# Patient Record
Sex: Male | Born: 1960 | Race: Black or African American | Hispanic: No | Marital: Single | Smoking: Never smoker
Health system: Southern US, Community
[De-identification: ages and names within clinical notes are randomized; demographics above are authoritative.]

## PROBLEM LIST (undated history)

## (undated) DIAGNOSIS — I10 Essential (primary) hypertension: Secondary | ICD-10-CM

## (undated) DIAGNOSIS — J189 Pneumonia, unspecified organism: Secondary | ICD-10-CM

## (undated) HISTORY — PX: HAND SURGERY: SHX662

---

## 1998-03-26 ENCOUNTER — Ambulatory Visit: Admission: RE | Admit: 1998-03-26 | Discharge: 1998-03-26 | Payer: Self-pay | Admitting: Psychiatry

## 2000-10-28 ENCOUNTER — Emergency Department (HOSPITAL_COMMUNITY): Admission: EM | Admit: 2000-10-28 | Discharge: 2000-10-28 | Payer: Self-pay | Admitting: Emergency Medicine

## 2000-10-28 ENCOUNTER — Encounter: Payer: Self-pay | Admitting: Emergency Medicine

## 2000-11-01 ENCOUNTER — Inpatient Hospital Stay (HOSPITAL_COMMUNITY): Admission: EM | Admit: 2000-11-01 | Discharge: 2000-11-16 | Payer: Self-pay | Admitting: Emergency Medicine

## 2000-11-01 ENCOUNTER — Encounter: Payer: Self-pay | Admitting: Orthopedic Surgery

## 2000-11-01 ENCOUNTER — Encounter (INDEPENDENT_AMBULATORY_CARE_PROVIDER_SITE_OTHER): Payer: Self-pay | Admitting: Specialist

## 2000-11-01 ENCOUNTER — Encounter (INDEPENDENT_AMBULATORY_CARE_PROVIDER_SITE_OTHER): Payer: Self-pay | Admitting: *Deleted

## 2000-11-02 ENCOUNTER — Encounter: Payer: Self-pay | Admitting: Internal Medicine

## 2000-11-05 ENCOUNTER — Encounter: Payer: Self-pay | Admitting: Internal Medicine

## 2000-11-07 ENCOUNTER — Encounter: Payer: Self-pay | Admitting: Orthopedic Surgery

## 2000-11-10 ENCOUNTER — Encounter: Payer: Self-pay | Admitting: Internal Medicine

## 2000-11-14 ENCOUNTER — Encounter: Payer: Self-pay | Admitting: Internal Medicine

## 2001-06-20 ENCOUNTER — Encounter: Payer: Self-pay | Admitting: Emergency Medicine

## 2001-06-20 ENCOUNTER — Emergency Department (HOSPITAL_COMMUNITY): Admission: EM | Admit: 2001-06-20 | Discharge: 2001-06-20 | Payer: Self-pay | Admitting: Emergency Medicine

## 2001-06-28 ENCOUNTER — Emergency Department (HOSPITAL_COMMUNITY): Admission: EM | Admit: 2001-06-28 | Discharge: 2001-06-28 | Payer: Self-pay | Admitting: Emergency Medicine

## 2001-10-08 ENCOUNTER — Encounter: Payer: Self-pay | Admitting: Emergency Medicine

## 2001-10-08 ENCOUNTER — Emergency Department (HOSPITAL_COMMUNITY): Admission: EM | Admit: 2001-10-08 | Discharge: 2001-10-08 | Payer: Self-pay | Admitting: Emergency Medicine

## 2002-06-03 ENCOUNTER — Emergency Department (HOSPITAL_COMMUNITY): Admission: EM | Admit: 2002-06-03 | Discharge: 2002-06-03 | Payer: Self-pay | Admitting: Emergency Medicine

## 2002-10-21 ENCOUNTER — Emergency Department (HOSPITAL_COMMUNITY): Admission: EM | Admit: 2002-10-21 | Discharge: 2002-10-21 | Payer: Self-pay | Admitting: Emergency Medicine

## 2003-02-12 ENCOUNTER — Encounter: Payer: Self-pay | Admitting: Emergency Medicine

## 2003-02-12 ENCOUNTER — Emergency Department (HOSPITAL_COMMUNITY): Admission: EM | Admit: 2003-02-12 | Discharge: 2003-02-12 | Payer: Self-pay | Admitting: Emergency Medicine

## 2003-10-04 ENCOUNTER — Emergency Department (HOSPITAL_COMMUNITY): Admission: EM | Admit: 2003-10-04 | Discharge: 2003-10-04 | Payer: Self-pay | Admitting: Emergency Medicine

## 2003-11-16 ENCOUNTER — Emergency Department (HOSPITAL_COMMUNITY): Admission: EM | Admit: 2003-11-16 | Discharge: 2003-11-16 | Payer: Self-pay | Admitting: Unknown Physician Specialty

## 2004-01-31 ENCOUNTER — Emergency Department (HOSPITAL_COMMUNITY): Admission: EM | Admit: 2004-01-31 | Discharge: 2004-01-31 | Payer: Self-pay | Admitting: Emergency Medicine

## 2004-08-13 ENCOUNTER — Emergency Department (HOSPITAL_COMMUNITY): Admission: EM | Admit: 2004-08-13 | Discharge: 2004-08-13 | Payer: Self-pay

## 2005-07-10 ENCOUNTER — Emergency Department (HOSPITAL_COMMUNITY): Admission: EM | Admit: 2005-07-10 | Discharge: 2005-07-10 | Payer: Self-pay | Admitting: Emergency Medicine

## 2005-09-03 ENCOUNTER — Emergency Department (HOSPITAL_COMMUNITY): Admission: EM | Admit: 2005-09-03 | Discharge: 2005-09-03 | Payer: Self-pay | Admitting: Emergency Medicine

## 2010-01-23 ENCOUNTER — Emergency Department (HOSPITAL_COMMUNITY): Admission: EM | Admit: 2010-01-23 | Discharge: 2010-01-23 | Payer: Self-pay | Admitting: Emergency Medicine

## 2010-02-03 ENCOUNTER — Emergency Department (HOSPITAL_COMMUNITY): Admission: EM | Admit: 2010-02-03 | Discharge: 2010-02-03 | Payer: Self-pay | Admitting: Family Medicine

## 2010-12-02 ENCOUNTER — Emergency Department (HOSPITAL_COMMUNITY)
Admission: EM | Admit: 2010-12-02 | Discharge: 2010-12-02 | Disposition: A | Discharge status: Home / Self Care | Payer: Self-pay | Attending: Emergency Medicine | Admitting: Emergency Medicine

## 2010-12-02 ENCOUNTER — Emergency Department (HOSPITAL_COMMUNITY): Payer: Self-pay

## 2010-12-02 DIAGNOSIS — S92309A Fracture of unspecified metatarsal bone(s), unspecified foot, initial encounter for closed fracture: Secondary | ICD-10-CM | POA: Insufficient documentation

## 2010-12-02 DIAGNOSIS — M79609 Pain in unspecified limb: Secondary | ICD-10-CM | POA: Insufficient documentation

## 2010-12-02 DIAGNOSIS — X500XXA Overexertion from strenuous movement or load, initial encounter: Secondary | ICD-10-CM | POA: Insufficient documentation

## 2010-12-07 ENCOUNTER — Emergency Department (HOSPITAL_COMMUNITY)
Admission: EM | Admit: 2010-12-07 | Discharge: 2010-12-07 | Disposition: A | Discharge status: Home / Self Care | Payer: Self-pay | Attending: Emergency Medicine | Admitting: Emergency Medicine

## 2010-12-07 DIAGNOSIS — S92309A Fracture of unspecified metatarsal bone(s), unspecified foot, initial encounter for closed fracture: Secondary | ICD-10-CM | POA: Insufficient documentation

## 2010-12-07 DIAGNOSIS — X58XXXA Exposure to other specified factors, initial encounter: Secondary | ICD-10-CM | POA: Insufficient documentation

## 2010-12-07 DIAGNOSIS — M79609 Pain in unspecified limb: Secondary | ICD-10-CM | POA: Insufficient documentation

## 2011-01-28 ENCOUNTER — Inpatient Hospital Stay (INDEPENDENT_AMBULATORY_CARE_PROVIDER_SITE_OTHER)
Admission: RE | Admit: 2011-01-28 | Discharge: 2011-01-28 | Disposition: A | Discharge status: Home / Self Care | Payer: Self-pay | Source: Ambulatory Visit

## 2011-01-28 DIAGNOSIS — S92909A Unspecified fracture of unspecified foot, initial encounter for closed fracture: Secondary | ICD-10-CM

## 2011-02-05 NOTE — Op Note (Signed)
[place]. [facility] ([facility])  Patient:    Dennis Vega, Dennis Vega                       MRN: [id number] Proc. Date: 11/09/00 Adm. Date:  [id number] Attending:  Dominica Severin CC:         Dewayne Shorter, M.D.  Lindell Spar. Chestine Spore, M.D.   Operative Report  PREOPERATIVE DIAGNOSIS:  Status cellulitis right hand, resolved with residual loss of motion about the hand and fluid in the radiocarpal joint, rule out indolent sepsis versus chronic synovitis.  POSTOPERATIVE DIAGNOSIS:  Positive fluid without gross purulence in the radiocarpal and mid carpal joints which was incised and drained.  The patient had a tenosynovitic reaction in this lesion.  This was debrided and culture sent.  The patient also had dense adhesions in the 4th dorsal compartment with noted tenosynovitis in this region.  This underwent incision and drainage and tenolysis as well as decompression of the compartment.  The patient also underwent 3rd and 2nd extensor compartment incisions without evidence for gross purulence.  OPERATION: 1. Incision and drainage/arthrotomy of right wrist joint including the    radiocarpal and mid carpal joints with synovectomy and culture. 2. Incision 3rd dorsal compartment (EPL tendon sheath). 3. Incision 2nd dorsal compartment sheath (ECRL dorsal compartment sheath). 4. A 4th dorsal compartment incision and tenosynovectomy with tenolysis about    the extensor tendon apparatus. 5. Manipulation of the MCP joints.  This was done about the index through    small finger MCP joints.  SURGEON:  Elisha Ponder, M.D.  ASSISTANT:  Della Goo, P.A.  COMPLICATIONS:  None.  ANESTHESIA:  Axillary block with IV sedation.  TOURNIQUET TIME:  Less than an hour.  ESTIMATED BLOOD LOSS:  Minimal.  DRAINS:  One  Cultures taken including aerobic, anaerobic, fungal and atypical mycobacterial cultures.  OPERATIVE INDICATIONS:  This patient is a very pleasant black male  who presents for the above mentioned procedure.  The patient was originally seen by myself.  He was admitted to the hospital secondary to cellulitis about the right hand.  I also noted interval changes in his chest x-ray and a pneumonia was found which is being addressed by Dr. Margaretmary Bayley.  The patient is currently on Tequin.  He had four days of Unasyn prior to Tequin being started.  The patient has resolved the cellulitis in his hand excellently; however, he has localized pain to the radial carpal joint.  He has had two aspirations and detailed exams by myself.  Despite antibiotics he is still tender in his wrist.  He has developed some increasing pain in the wrist as well as loss of function in the fingers.  His MPC joints are significantly stiff and he has poor flexion and extension about the fingers.  I have discussed with the patient that although his laboratory parameters have normalized, I can not rule out indolent infection still present in his wrist and given his lack of clinical response to range of motion in wrist testing, I would prefer to take an operative look at his wrist and ensure that there is not significant indolent infection present.  MRIs have been performed and a very thorough evaluation undertaken in my opinion.  ID has been consulted as well.  The patient understands the risks and benefits of surgery and desires to proceed.  OPERATIVE FINDINGS:  This patient had chronic type of synovitic reaction in the wrist joint.  There was no  gross purulence but there was fluid that was cultured.  This was not thick and indicative of gross purulence.  There was no foul smell.  I did culture this.  It did come from both the radiocarpal and mid carpal joints.  Scapholunate interosseous ligament was kept intact.  I performed a synovectomy and sent the synovial tissue for culture and pathology.  The patient underwent incision of the EPL and ECRB tendon sheath. No purulence was  noted.  Some inflammatory changes about the EPL were noted but certainly this was not excessively impressive.  The 4th dorsal compartment had an impressive tenosynovitic reaction around the tendons as well as adhesions.  I performed a tenosynovectomy and lysis of adhesions in this region as well as manipulation to the MCP joints which restored his MCP range of motion passively to normal.  Preoperatively passive range of motion was restricted.  DESCRIPTION OF PROCEDURE:  The patient was supine subsequently underwent anesthesia.  Dr. Noreene Larsson presided over anesthetic measures and instituted a right upper extremity axillary block at my request.  This was tolerated by the patient excellently.  He was taken to the operative suite, laid supine, appropriately padded, prepped and draped in the usual sterile fashion with Betadine scrub and paint.  Following this, the patient was given IV sedation. The arm was elevated.  Tourniquet insufflated to 250 mmHg and incision approximately 4 cm was made just distal to Listers tubercle.  Dissection was carried through skin with knife blade.  Following this, blunt dissection was carried down to the extensor retinaculum.  The extensor retinaculum was identified and split.  I then opened sequentially the EPL tendon sheath.  EPL was without any gross fluid within the sheath.  There was no disruption of the tendon.  Change hemorrhagic changes but no focal synovitic reaction that needed to be debrided.  The ECR tendon sheath was then opened. I similarly noted no significant tenosynovitic reaction in this region.  Following this, the patient had access to the radial carpal and mid carpal joints obtained. This was done with knife dissection.  This was done just off of the radial carpal capsule.  A T incision was made to access the radial carpal joint. Fluid, fairly clear to brown in nature but without any thick purulence was noted.  This was cultured.  Following this  a synovectomy was performed by  thickened synovium was noted.  Following this, very small horizontal cuts were made so that the main carpal joint could be entered without disrupting the scapholunate interosseous ligament.  This was done to my satisfaction and the mid carpal joint was opened and following this, 2 cc of fluid egressed.  This was similarly clearly thin, slightly clear to brown tinged.  The patient had a synovitic reaction noted in the joint and this was removed with a sharp knife blade and rongeur.  Cultures were taken at this time and tissue was sent for pathologic purposes.  Following this, the patient underwent thorough irrigation of the radial carpal and mid carpal joints.  Freer elevator was placed in both joints.  A Watson scaphoid shift test was performed to demonstrate SL interval competency and the patient had viewing of the SL interval which was noted to competent.  A meticulous synovectomy was performed.  I was very happy with this.  I was not overly impressed with purulent findings.  The patient then underwent incision of the 4th dorsal compartment.  The 4th dorsal compartment had a marked tenosynovitis and adhesions in this  region.  I performed at this time a tenolysis and a tenosynovectomy about the extensor tendon. Manipulation of the index to the small finger MCP joints was accomplished which restored MCP passive range of motion to normal.  This was done to my satisfaction without difficulty. Following this, further adhesions and synovitic tissue was removed from the 4th dorsal compartment.  This was done under 4.0 loupe magnification without difficulty.  Following this, the tourniquet was deflated.  All areas were copiously irrigated.  I was most impressed with the tenosynovitic reaction in the 4th dorsal compartment and adhesions in this region.  The radial carpal and mid carpal joints certainly had synovitis noted.  There was no thick purulence  noted here.  The patient had a drain placed without difficulty and the wound loosely closed.  He had excellent refill and no excessive bleeding as bleeding was controlled with bipolar electrocautery.  The patient tolerated the procedure well without difficulty.  All sponge and instrument counts were reported as correct.  He had a compressive dressing followed by volar plaster splint placed without difficulty at the conclusion of the operation.  The patient will be continued on Tequin antibiotics. O2 will be instituted for range of motion and close observation performed.  I have asked him to notify me should any problems occur, etc.  Will monitor his condition closely. DD:  11/09/00 TD:  11/10/00 Job: [id number]
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
## 2011-03-01 ENCOUNTER — Inpatient Hospital Stay (HOSPITAL_COMMUNITY)
Admission: EM | Admit: 2011-03-01 | Discharge: 2011-03-01 | DRG: 605 | Disposition: A | Discharge status: Home / Self Care | Payer: Self-pay | Attending: Surgery | Admitting: Surgery

## 2011-03-01 ENCOUNTER — Inpatient Hospital Stay (HOSPITAL_COMMUNITY): Payer: Self-pay

## 2011-03-01 ENCOUNTER — Emergency Department (HOSPITAL_COMMUNITY): Payer: Self-pay

## 2011-03-01 DIAGNOSIS — F17200 Nicotine dependence, unspecified, uncomplicated: Secondary | ICD-10-CM | POA: Diagnosis present

## 2011-03-01 DIAGNOSIS — Y92009 Unspecified place in unspecified non-institutional (private) residence as the place of occurrence of the external cause: Secondary | ICD-10-CM

## 2011-03-01 DIAGNOSIS — S31109A Unspecified open wound of abdominal wall, unspecified quadrant without penetration into peritoneal cavity, initial encounter: Principal | ICD-10-CM | POA: Diagnosis present

## 2011-03-01 DIAGNOSIS — R0902 Hypoxemia: Secondary | ICD-10-CM | POA: Diagnosis present

## 2011-03-01 DIAGNOSIS — F1010 Alcohol abuse, uncomplicated: Secondary | ICD-10-CM | POA: Diagnosis present

## 2011-03-01 DIAGNOSIS — S41009A Unspecified open wound of unspecified shoulder, initial encounter: Secondary | ICD-10-CM | POA: Diagnosis present

## 2011-03-01 DIAGNOSIS — Y998 Other external cause status: Secondary | ICD-10-CM

## 2011-03-01 LAB — CBC
HCT: 43.5 % (ref 39.0–52.0)
MCH: 32.9 pg (ref 26.0–34.0)
MCHC: 35.7 g/dL (ref 30.0–36.0)
MCV: 92 fL (ref 78.0–100.0)
Platelets: 273 10*3/uL (ref 150–400)
Platelets: 232 10*3/uL (ref 150–400)
RBC: 4.9 MIL/uL (ref 4.22–5.81)
RDW: 12.5 % (ref 11.5–15.5)
WBC: 10.2 10*3/uL (ref 4.0–10.5)

## 2011-03-01 LAB — COMPREHENSIVE METABOLIC PANEL
AST: 15 U/L (ref 0–37)
BUN: 13 mg/dL (ref 6–23)
CO2: 20 mEq/L (ref 19–32)
Calcium: 8.8 mg/dL (ref 8.4–10.5)
Creatinine, Ser: 1.04 mg/dL (ref 0.4–1.5)

## 2011-03-01 LAB — TYPE AND SCREEN
Antibody Screen: NEGATIVE
Unit division: 0

## 2011-03-01 LAB — POCT I-STAT, CHEM 8
BUN: 12 mg/dL (ref 6–23)
Calcium, Ion: 1.09 mmol/L — ABNORMAL LOW (ref 1.12–1.32)
Creatinine, Ser: 1.3 mg/dL (ref 0.4–1.5)
Glucose, Bld: 101 mg/dL — ABNORMAL HIGH (ref 70–99)
Hemoglobin: 17 g/dL (ref 13.0–17.0)
Sodium: 140 mEq/L (ref 135–145)
TCO2: 19 mmol/L (ref 0–100)

## 2011-03-01 LAB — SAMPLE TO BLOOD BANK

## 2011-03-01 LAB — PROTIME-INR: INR: 1.01 (ref 0.00–1.49)

## 2011-03-01 LAB — BASIC METABOLIC PANEL
BUN: 8 mg/dL (ref 6–23)
Chloride: 105 mEq/L (ref 96–112)
GFR calc Af Amer: >60 mL/min (ref >60)
GFR calc non Af Amer: >60 mL/min (ref >60)
Potassium: 3.6 mEq/L (ref 3.5–5.1)
Sodium: 139 mEq/L (ref 135–145)

## 2011-03-01 LAB — ABO/RH: ABO/RH(D): A POS

## 2011-03-01 LAB — ETHANOL: Alcohol, Ethyl (B): 225 mg/dL — ABNORMAL HIGH (ref 0–10)

## 2011-03-01 LAB — MRSA PCR SCREENING: MRSA by PCR: NEGATIVE

## 2011-03-01 MED ORDER — IOHEXOL 300 MG/ML  SOLN
100.0000 mL | Freq: Once | INTRAMUSCULAR | Status: AC | PRN
  Administered 2011-03-01: 100 mL via INTRAVENOUS
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
## 2012-03-08 ENCOUNTER — Encounter (HOSPITAL_COMMUNITY): Payer: Self-pay | Admitting: *Deleted

## 2012-03-08 ENCOUNTER — Emergency Department (HOSPITAL_COMMUNITY)
Admission: EM | Admit: 2012-03-08 | Discharge: 2012-03-08 | Disposition: A | Discharge status: Home / Self Care | Payer: Self-pay | Attending: Emergency Medicine | Admitting: Emergency Medicine

## 2012-03-08 DIAGNOSIS — K044 Acute apical periodontitis of pulpal origin: Secondary | ICD-10-CM | POA: Insufficient documentation

## 2012-03-08 DIAGNOSIS — R220 Localized swelling, mass and lump, head: Secondary | ICD-10-CM | POA: Insufficient documentation

## 2012-03-08 DIAGNOSIS — K047 Periapical abscess without sinus: Secondary | ICD-10-CM

## 2012-03-08 MED ORDER — PENICILLIN V POTASSIUM 500 MG PO TABS: 500.0000 mg | ORAL_TABLET | Freq: Three times a day (TID) | ORAL | Status: AC

## 2012-03-08 MED ORDER — HYDROCODONE-ACETAMINOPHEN 5-325 MG PO TABS: ORAL_TABLET | ORAL | Status: AC

## 2012-03-08 MED ORDER — IBUPROFEN 800 MG PO TABS: 800.0000 mg | ORAL_TABLET | Freq: Three times a day (TID) | ORAL | Status: AC | PRN

## 2012-03-08 NOTE — ED Provider Notes (Signed)
History     CSN: [id number]  Arrival date & time 03/08/12  1831   First MD Initiated Contact with Patient 03/08/12 1858      Chief Complaint  Patient presents with  . Facial Swelling    (Consider location/radiation/quality/duration/timing/severity/associated sxs/prior treatment) HPI Comments: Patient presents with complaint of right upper cheek swelling and some redness and pain for the past 2 days. Patient has noted an abscess to the gums in that area. No treatments prior to arrival. No neck pain or neck swelling. Shortness of breath. Patient denies fever. Onset was gradual. Course is constant. Pain is not radiating. Nothing makes symptoms better or worse.  Patient is a 51 y.o. male presenting with tooth pain. The history is provided by the patient.  Dental PainThe primary symptoms include mouth pain. Primary symptoms do not include headaches, fever, shortness of breath or sore throat. The symptoms began 2 days ago. The symptoms are unchanged. The symptoms are new. The symptoms occur constantly.  Additional symptoms include: gum swelling and facial swelling. Additional symptoms do not include: trismus, trouble swallowing and ear pain.    History reviewed. No pertinent past medical history.  History reviewed. No pertinent past surgical history.  No family history on file.  History  Substance Use Topics  . Smoking status: Never Smoker   . Smokeless tobacco: Not on file  . Alcohol Use: 4.8 oz/week    8 Cans of beer per week      Review of Systems  Constitutional: Negative for fever.  HENT: Positive for facial swelling and dental problem. Negative for ear pain, sore throat, trouble swallowing and neck pain.   Respiratory: Negative for shortness of breath and stridor.   Skin: Negative for color change.  Neurological: Negative for headaches.    Allergies  Review of patient's allergies indicates no known allergies.  Home Medications  No current outpatient prescriptions on  file.  BP 121/83  Pulse 66  Temp 98.6 F (37 C) (Oral)  Resp 14  Ht 5\' 9"  (1.753 m)  Wt 180 lb (81.647 kg)  BMI 26.58 kg/m2  SpO2 97%  Physical Exam  Nursing note and vitals reviewed. Constitutional: He appears well-developed and well-nourished.  HENT:  Head: Normocephalic and atraumatic. No trismus in the jaw.  Right Ear: Tympanic membrane, external ear and ear canal normal.  Left Ear: Tympanic membrane, external ear and ear canal normal.  Nose: Nose normal.  Mouth/Throat: Uvula is midline, oropharynx is clear and moist and mucous membranes are normal. Abnormal dentition. Dental caries present. No dental abscesses or uvula swelling. No tonsillar abscesses.       Patient with R maxillary tooth/gum pain and tenderness to palpation in area of premolars. Erythema of gums. There is a small pustule on gums.   Eyes: Pupils are equal, round, and reactive to light.  Neck: Normal range of motion. Neck supple.       No neck swelling or Lugwig's angina  Neurological: He is alert.  Skin: Skin is warm and dry.  Psychiatric: He has a normal mood and affect.    ED Course  Procedures (including critical care time)  Labs Reviewed - No data to display No results found.   1. Dental infection     7:45 PM Patient seen and examined.   Vital signs reviewed and are as follows: Filed Vitals:   03/08/12 1854  BP: 121/83  Pulse: 66  Temp: 98.6 F (37 C)  Resp: 14   Patient counseled to take prescribed  medications as directed, return with worsening facial or neck swelling, and to follow-up with his dentist as soon as possible.   Patient counseled on use of narcotic pain medications. Counseled not to combine these medications with others containing tylenol. Urged not to drink alcohol, drive, or perform any other activities that requires focus while taking these medications. The patient verbalizes understanding and agrees with the plan.  Pt urged to return with worsening pain, worsening  swelling, expanding area of redness or streaking up extremity, fever, or any other concerns. Urged to take complete course of antibiotics as prescribed. Counseled to take pain medications as prescribed. Pt verbalizes understanding and agrees with plan.  MDM  Patient with toothache.  No gross abscess.  Exam unconcerning for Ludwig's angina or other deep tissue infection in neck.  Will treat with penicillin and pain medicine.  Urged patient to follow-up with dentist.           Renne Crigler, PA 03/08/12 1953

## 2012-03-08 NOTE — ED Provider Notes (Signed)
Medical screening examination/treatment/procedure(s) were performed by non-physician practitioner and as supervising physician I was immediately available for consultation/collaboration.   Dayton Bailiff, MD 03/08/12 [phone]

## 2012-03-08 NOTE — ED Notes (Signed)
Pt reports right sided facial swelling that began Monday night. States he noticed pain on right side of cheek, saw abscess. Pain progressing, worse at night, per pt.

## 2012-03-08 NOTE — Discharge Instructions (Signed)
Please read and follow all provided instructions.  Your diagnoses today include:  1. Dental infection     Tests performed today include:  Vital signs. See below for your results today.   Medications prescribed:   Vicodin (hydrocodone/acetaminophen) - narcotic pain medication  You have been prescribed narcotic pain medication such as Vicodin, Percocet, or Ultram: DO NOT drive or perform any activities that require you to be awake and alert because this medicine can make you drowsy. BE VERY CAREFUL not to take multiple medicines containing Tylenol (also called acetaminophen). Doing so can lead to an overdose which can damage your liver and cause liver failure and possibly death.    Ibuprofen - anti-inflammatory pain medication  Do not exceed 800mg  ibuprofen every 8 hours  You have been prescribed an anti-inflammatory medication or NSAID. Take with food. Take smallest effective dose for the shortest duration needed for your pain. Stop taking if you experience stomach pain or vomiting.    Penicillin - antibiotic for dental infection  You have been prescribed an antibiotic medicine: take the entire course of medicine even if you are feeling better. Stopping early can cause the antibiotic not to work.  Take any prescribed medications only as directed.  Home care instructions:  Follow any educational materials contained in this packet.  Follow-up instructions: Please follow-up with your dentist for further evaluation of your symptoms. If you do not have a dentist or primary care doctor -- see below for referral information.   The exam and treatment you received today has been provided on an emergency basis only. This is not a substitute for complete medical or dental care. If your problem worsens or new symptoms (problems) appear, and you are unable to arrange prompt follow-up care with your dentist, return to this location.  Return instructions:   Please return to the Emergency  Department if you experience worsening symptoms.  Please return if you develop a fever, you develop more swelling in your face or neck, you have trouble breathing or swallowing food.  Please return if you have any other emergent concerns.  Additional Information:  Your vital signs today were: BP 121/83  Pulse 66  Temp 98.6 F (37 C) (Oral)  Resp 14  Ht 5\' 9"  (1.753 m)  Wt 180 lb (81.647 kg)  BMI 26.58 kg/m2  SpO2 97% If your blood pressure (BP) was elevated above 135/85 this visit, please have this repeated by your doctor within one month. -------------- No Primary Care Doctor Call [facility]  [phone] Other agencies that provide inexpensive medical care    [facility]  [phone]    [facility]  [phone]    [facility]  [phone]    [facility]  [phone]    [facility]  [phone]    [facility]  [phone] -------------- RESOURCE GUIDE:  Dental Problems  Patients with Medicaid: [facility] [phone] [address]                                            [phone] [address] Phone:  [phone]  Phone:  [phone]  If unable to pay or uninsured, contact:  [facility] or [facility]. to become qualified for the adult dental clinic.  Chronic Pain Problems Contact [facility]  [phone] Patients need to be referred by their primary care doctor.  Insufficient Money for Medicine Contact [employer]:  call "211" or [facility] [phone].  Psychological Services [facility]  [phone] [facility]  [phone] [facility]   [phone] (emergency services [phone])  Substance Abuse Resources [facility]  [phone] [facility] [phone] [facility] [place] [phone] Floydene Flock [phone] Residential &  Outpatient Substance Abuse Program  [phone]  Abuse/Neglect [facility] Child Abuse Hotline 7402230364 [facility] Child Abuse Hotline [phone] (After Hours)  Emergency Shelter [facility] [phone]  Maternity Homes [facility] [place] [facility] [phone] [place] [facility] [phone]  [facility] Resources  [facility]     [employer]                          [facility]. [address] [place]                       [address]      [address]  Blondell Reveal Phone:  [phone]                                   Phone:  [phone]                 Phone:  [phone]  [facility] Mental Health Phone:  [phone]  [facility] Child Abuse Hotline 9205312947 [phone] (After Hours)

## 2013-07-17 ENCOUNTER — Encounter (HOSPITAL_COMMUNITY): Payer: Self-pay | Admitting: Emergency Medicine

## 2013-07-17 ENCOUNTER — Emergency Department (HOSPITAL_COMMUNITY)
Admission: EM | Admit: 2013-07-17 | Discharge: 2013-07-17 | Disposition: A | Discharge status: Home / Self Care | Payer: Self-pay | Attending: Emergency Medicine | Admitting: Emergency Medicine

## 2013-07-17 DIAGNOSIS — N390 Urinary tract infection, site not specified: Secondary | ICD-10-CM | POA: Insufficient documentation

## 2013-07-17 LAB — URINALYSIS, ROUTINE W REFLEX MICROSCOPIC
Bilirubin Urine: NEGATIVE
Glucose, UA: NEGATIVE mg/dL
Hgb urine dipstick: NEGATIVE
Ketones, ur: NEGATIVE mg/dL
Nitrite: NEGATIVE
Protein, ur: NEGATIVE mg/dL
Specific Gravity, Urine: 1.016 (ref 1.005–1.030)
Urobilinogen, UA: 1 mg/dL (ref 0.0–1.0)
pH: 7 (ref 5.0–8.0)

## 2013-07-17 LAB — URINE MICROSCOPIC-ADD ON

## 2013-07-17 MED ORDER — CIPROFLOXACIN HCL 500 MG PO TABS: 500.0000 mg | ORAL_TABLET | Freq: Two times a day (BID) | ORAL | Status: DC

## 2013-07-17 MED ORDER — IBUPROFEN 800 MG PO TABS: 800.0000 mg | ORAL_TABLET | Freq: Three times a day (TID) | ORAL | Status: DC | PRN

## 2013-07-17 NOTE — Progress Notes (Signed)
[facility] provided pt with a list of primary care resources.  °

## 2013-07-17 NOTE — ED Provider Notes (Signed)
CSN: [id number]     Arrival date & time 07/17/13  4098 History   First MD Initiated Contact with Patient 07/17/13 [phone]     Chief Complaint  Patient presents with  . Dysuria   (Consider location/radiation/quality/duration/timing/severity/associated sxs/prior Treatment) Patient is a 52 y.o. male presenting with dysuria. The history is provided by the patient (the pt complains of dysuria for a few days).  Dysuria This is a new problem. The current episode started 2 days ago. The problem occurs constantly. The problem has not changed since onset.Pertinent negatives include no chest pain, no abdominal pain and no headaches. Nothing aggravates the symptoms. Nothing relieves the symptoms.    History reviewed. No pertinent past medical history. History reviewed. No pertinent past surgical history. No family history on file. History  Substance Use Topics  . Smoking status: Never Smoker   . Smokeless tobacco: Not on file  . Alcohol Use: 4.8 oz/week    8 Cans of beer per week    Review of Systems  Constitutional: Negative for appetite change and fatigue.  HENT: Negative for congestion, ear discharge and sinus pressure.   Eyes: Negative for discharge.  Respiratory: Negative for cough.   Cardiovascular: Negative for chest pain.  Gastrointestinal: Negative for abdominal pain and diarrhea.  Genitourinary: Positive for dysuria. Negative for frequency and hematuria.  Musculoskeletal: Negative for back pain.  Skin: Negative for rash.  Neurological: Negative for seizures and headaches.  Psychiatric/Behavioral: Negative for hallucinations.    Allergies  Review of patient's allergies indicates no known allergies.  Home Medications   Current Outpatient Rx  Name  Route  Sig  Dispense  Refill  . ciprofloxacin (CIPRO) 500 MG tablet   Oral   Take 1 tablet (500 mg total) by mouth 2 (two) times daily. One po bid x 7 days   20 tablet   0   . ibuprofen (ADVIL,MOTRIN) 800 MG tablet   Oral  Take 1 tablet (800 mg total) by mouth every 8 (eight) hours as needed for pain.   21 tablet   0    BP 156/92  Pulse 58  Temp(Src) 97.9 F (36.6 C) (Oral)  Resp 14  SpO2 100% Physical Exam  Constitutional: He is oriented to person, place, and time. He appears well-developed.  HENT:  Head: Normocephalic.  Eyes: Conjunctivae and EOM are normal. No scleral icterus.  Neck: Neck supple. No thyromegaly present.  Cardiovascular: Normal rate and regular rhythm.  Exam reveals no gallop and no friction rub.   No murmur heard. Pulmonary/Chest: No stridor. He has no wheezes. He has no rales. He exhibits no tenderness.  Abdominal: He exhibits no distension. There is no tenderness. There is no rebound.  Musculoskeletal: Normal range of motion. He exhibits no edema.  Lymphadenopathy:    He has no cervical adenopathy.  Neurological: He is oriented to person, place, and time. He exhibits normal muscle tone. Coordination normal.  Skin: No rash noted. No erythema.  Psychiatric: He has a normal mood and affect. His behavior is normal.    ED Course  Procedures (including critical care time) Labs Review Labs Reviewed  URINALYSIS, ROUTINE W REFLEX MICROSCOPIC - Abnormal; Notable for the following:    APPearance CLOUDY (*)    Leukocytes, UA SMALL (*)    All other components within normal limits  URINE MICROSCOPIC-ADD ON - Abnormal; Notable for the following:    Bacteria, UA FEW (*)    All other components within normal limits  URINE CULTURE   Imaging  Review No results found.  EKG Interpretation   None       MDM   1. UTI (lower urinary tract infection)        Benny Lennert, MD 07/17/13 1029

## 2013-07-17 NOTE — ED Notes (Signed)
Per pt, states trouble urinating last Monday-frequency, although not a lot of urine-states it was clearing up-states now increased urination with back discomfort

## 2013-07-20 LAB — URINE CULTURE: Colony Count: >=100000

## 2013-07-22 ENCOUNTER — Telehealth (HOSPITAL_COMMUNITY): Payer: Self-pay | Admitting: Emergency Medicine

## 2013-07-22 NOTE — ED Notes (Signed)
Post ED Visit - Positive Culture Follow-up  Culture report reviewed by antimicrobial stewardship pharmacist: []  Wes Dulaney, Pharm.D., BCPS []  Celedonio Miyamoto, Pharm.D., BCPS [x]  Georgina Pillion, Pharm.D., BCPS []  [place], [address].D., BCPS, AAHIVP []  Estella Husk, Pharm.D., BCPS, AAHIVP  Positive urine culture Treated with Cipro, organism sensitive to the same and no further patient follow-up is required at this time.  Kylie A Holland 07/22/2013, 10:23 AM

## 2013-10-25 ENCOUNTER — Encounter: Payer: Self-pay | Admitting: Internal Medicine

## 2015-08-25 ENCOUNTER — Emergency Department (HOSPITAL_COMMUNITY): Payer: Commercial Managed Care - PPO

## 2015-08-25 ENCOUNTER — Emergency Department (HOSPITAL_COMMUNITY)
Admission: EM | Admit: 2015-08-25 | Discharge: 2015-08-25 | Discharge status: Against Medical Advice | Payer: Commercial Managed Care - PPO | Attending: Emergency Medicine | Admitting: Emergency Medicine

## 2015-08-25 ENCOUNTER — Encounter (HOSPITAL_COMMUNITY): Payer: Self-pay | Admitting: *Deleted

## 2015-08-25 DIAGNOSIS — R0781 Pleurodynia: Secondary | ICD-10-CM

## 2015-08-25 DIAGNOSIS — R0789 Other chest pain: Secondary | ICD-10-CM | POA: Diagnosis not present

## 2015-08-25 DIAGNOSIS — R079 Chest pain, unspecified: Secondary | ICD-10-CM | POA: Diagnosis present

## 2015-08-25 LAB — I-STAT TROPONIN, ED
Troponin i, poc: 0 ng/mL (ref 0.00–0.08)
Troponin i, poc: 0.02 ng/mL (ref 0.00–0.08)

## 2015-08-25 LAB — BASIC METABOLIC PANEL
Anion gap: 8 (ref 5–15)
BUN: 11 mg/dL (ref 6–20)
CO2: 28 mmol/L (ref 22–32)
Calcium: 9.7 mg/dL (ref 8.9–10.3)
Chloride: 101 mmol/L (ref 101–111)
Creatinine, Ser: 0.9 mg/dL (ref 0.61–1.24)
GFR calc Af Amer: >60 mL/min (ref >60)
GFR calc non Af Amer: >60 mL/min (ref >60)
Glucose, Bld: 116 mg/dL — ABNORMAL HIGH (ref 65–99)
Potassium: 4 mmol/L (ref 3.5–5.1)
Sodium: 137 mmol/L (ref 135–145)

## 2015-08-25 LAB — D-DIMER, QUANTITATIVE: D-Dimer, Quant: >20 ug/mL-FEU — ABNORMAL HIGH (ref 0.00–0.50)

## 2015-08-25 LAB — CBC
HCT: 49.1 % (ref 39.0–52.0)
Hemoglobin: 17 g/dL (ref 13.0–17.0)
MCH: 33.7 pg (ref 26.0–34.0)
MCHC: 34.6 g/dL (ref 30.0–36.0)
MCV: 97.2 fL (ref 78.0–100.0)
Platelets: 227 10*3/uL (ref 150–400)
RBC: 5.05 MIL/uL (ref 4.22–5.81)
RDW: 12.1 % (ref 11.5–15.5)
WBC: 5.5 10*3/uL (ref 4.0–10.5)

## 2015-08-25 MED ORDER — ASPIRIN 81 MG PO CHEW
324.0000 mg | CHEWABLE_TABLET | Freq: Once | ORAL | Status: AC
  Administered 2015-08-25: 324 mg via ORAL
  Filled 2015-08-25: qty 4

## 2015-08-25 NOTE — ED Notes (Signed)
Pt states "I have to go catch this bus." RN explained delay to pt. Pt refusing to stay. MD made aware.

## 2015-08-25 NOTE — ED Provider Notes (Signed)
CSN: [id number]     Arrival date & time 08/25/15  1339 History   First MD Initiated Contact with Patient 08/25/15 1753     Chief Complaint  Patient presents with  . Chest Pain     (Consider location/radiation/quality/duration/timing/severity/associated sxs/prior Treatment) HPI Patient presents with left-sided sharp chest pain that started this morning at 9 AM. He states the pain has gradually been increasing. It's worse with deep breathing and coughing. Patient states the pain is improved when sitting up. Denies fever or chills. Denies recent URI. Denies productive cough. No lower extremity swelling or pain. No extended travel or immobilization periods. No previous chest pain. No family history of early coronary artery disease. No known trauma but patient does admit he does a lot of lifting at his work. History reviewed. No pertinent past medical history. History reviewed. No pertinent past surgical history. History reviewed. No pertinent family history. Social History  Substance Use Topics  . Smoking status: Never Smoker   . Smokeless tobacco: None  . Alcohol Use: 4.8 oz/week    8 Cans of beer per week    Review of Systems  Constitutional: Negative for fever and chills.  HENT: Negative for congestion, sinus pressure and sore throat.   Respiratory: Negative for cough and shortness of breath.   Cardiovascular: Positive for chest pain. Negative for palpitations and leg swelling.  Gastrointestinal: Negative for nausea, vomiting, abdominal pain and diarrhea.  Genitourinary: Negative for frequency and flank pain.  Musculoskeletal: Negative for myalgias, back pain, neck pain and neck stiffness.  Skin: Negative for rash and wound.  Neurological: Negative for dizziness, weakness, light-headedness, numbness and headaches.  All other systems reviewed and are negative.     Allergies  Review of patient's allergies indicates no known allergies.  Home Medications   Prior to Admission  medications   Not on File   BP 162/95 mmHg  Pulse 70  Temp(Src) 97.5 F (36.4 C) (Oral)  Resp 25  SpO2 99% Physical Exam  Constitutional: He is oriented to person, place, and time. He appears well-developed and well-nourished. No distress.  HENT:  Head: Normocephalic and atraumatic.  Mouth/Throat: Oropharynx is clear and moist. No oropharyngeal exudate.  Eyes: EOM are normal. Pupils are equal, round, and reactive to light.  Neck: Normal range of motion. Neck supple. No JVD present.  Cardiovascular: Normal rate and regular rhythm.  Exam reveals no gallop and no friction rub.   No murmur heard. Pulmonary/Chest: Effort normal and breath sounds normal. No respiratory distress. He has no wheezes. He has no rales. He exhibits no tenderness.  Abdominal: Soft. Bowel sounds are normal. He exhibits no distension and no mass. There is no tenderness. There is no rebound and no guarding.  Musculoskeletal: Normal range of motion. He exhibits no edema or tenderness.  No CVA tenderness. No lower extremity swelling or pain. Distal pulses intact.  Neurological: He is alert and oriented to person, place, and time.  5/5 motor in all extremities. Sensation is fully intact.  Skin: Skin is warm and dry. No rash noted. No erythema.  Psychiatric: He has a normal mood and affect. His behavior is normal.  Nursing note and vitals reviewed.   ED Course  Procedures (including critical care time) Labs Review Labs Reviewed  BASIC METABOLIC PANEL - Abnormal; Notable for the following:    Glucose, Bld 116 (*)    All other components within normal limits  CBC  D-DIMER, QUANTITATIVE (NOT AT [facility])  I-STAT TROPOININ, ED  I-STAT TROPOININ,  ED    Imaging Review Dg Chest 2 View  08/25/2015  CLINICAL DATA:  One day history of left upper chest tightness EXAM: CHEST  2 VIEW COMPARISON:  July 10 2005 FINDINGS: There is minimal scarring in the left base. There is no edema or consolidation. Heart size and pulmonary  vascularity are normal. No adenopathy. No pneumothorax. There is mild degenerative change in the thoracic spine. IMPRESSION: Mild scarring left base.  No edema or consolidation. Electronically Signed   By: Bretta BangWilliam  Woodruff III M.D.   On: 08/25/2015 14:48   I have personally reviewed and evaluated these images and lab results as part of my medical decision-making.   EKG Interpretation   Date/Time:  Monday August 25 2015 13:41:56 EST Ventricular Rate:  92 PR Interval:  134 QRS Duration: 90 QT Interval:  394 QTC Calculation: 487 R Axis:   49 Text Interpretation:  Normal sinus rhythm with sinus arrhythmia Prolonged  QT Abnormal ECG Confirmed by Ranae PalmsYELVERTON  MD, Rhenda [place] ([id number]) on 08/25/2015  5:55:33 PM      MDM   Final diagnoses:  None    Patient does not have a primary care physician. No previous history of hypertension. Chest pain is atypical. Pleuritic in nature.  Patient left prior to d-dimer results or discharge instructions   Loren Raceravid Ravon Mcilhenny, MD 08/26/15 [phone]

## 2015-08-25 NOTE — ED Notes (Signed)
Pt reports mid chest pains today, pain increases with breathing, coughing and movement. No acute distress noted at triage.

## 2015-08-26 ENCOUNTER — Encounter (HOSPITAL_COMMUNITY): Payer: Self-pay | Admitting: Emergency Medicine

## 2015-08-26 ENCOUNTER — Emergency Department (HOSPITAL_COMMUNITY): Payer: Commercial Managed Care - PPO

## 2015-08-26 ENCOUNTER — Emergency Department (HOSPITAL_COMMUNITY)
Admission: EM | Admit: 2015-08-26 | Discharge: 2015-08-26 | Disposition: A | Discharge status: Home / Self Care | Payer: Commercial Managed Care - PPO | Attending: Emergency Medicine | Admitting: Emergency Medicine

## 2015-08-26 DIAGNOSIS — R079 Chest pain, unspecified: Secondary | ICD-10-CM | POA: Insufficient documentation

## 2015-08-26 LAB — BASIC METABOLIC PANEL
ANION GAP: 10 (ref 5–15)
BUN: 10 mg/dL (ref 6–20)
CHLORIDE: 100 mmol/L — AB (ref 101–111)
CO2: 27 mmol/L (ref 22–32)
CREATININE: 0.91 mg/dL (ref 0.61–1.24)
Calcium: 9.6 mg/dL (ref 8.9–10.3)
GFR calc non Af Amer: >60 mL/min (ref >60)
Glucose, Bld: 124 mg/dL — ABNORMAL HIGH (ref 65–99)
POTASSIUM: 3.9 mmol/L (ref 3.5–5.1)
SODIUM: 137 mmol/L (ref 135–145)

## 2015-08-26 LAB — CBC
HEMATOCRIT: 50.4 % (ref 39.0–52.0)
HEMOGLOBIN: 17.5 g/dL — AB (ref 13.0–17.0)
MCH: 33.7 pg (ref 26.0–34.0)
MCHC: 34.7 g/dL (ref 30.0–36.0)
MCV: 96.9 fL (ref 78.0–100.0)
PLATELETS: 215 10*3/uL (ref 150–400)
RBC: 5.2 MIL/uL (ref 4.22–5.81)
RDW: 12.1 % (ref 11.5–15.5)
WBC: 4.5 10*3/uL (ref 4.0–10.5)

## 2015-08-26 LAB — I-STAT TROPONIN, ED: Troponin i, poc: 0.01 ng/mL (ref 0.00–0.08)

## 2015-08-26 LAB — D-DIMER, QUANTITATIVE (NOT AT ARMC)

## 2015-08-26 MED ORDER — IOHEXOL 350 MG/ML SOLN: 100.0000 mL | Freq: Once | INTRAVENOUS | Status: DC | PRN

## 2015-08-26 NOTE — ED Notes (Signed)
Denies pain right now; states he came back to get his test results and a note for work yesterday so that he can go back to work and is medically clear.

## 2015-08-26 NOTE — ED Provider Notes (Signed)
CSN: [id number]     Arrival date & time 08/26/15  0546 History   First MD Initiated Contact with Patient 08/26/15 0622     Chief Complaint  Patient presents with  . Chest Pain     (Consider location/radiation/quality/duration/timing/severity/associated sxs/prior Treatment) HPI Comments: Patient presents to the ED with a chief complaint of chest pain.  States that he was seen last night, but had to leave prior to having received all of his results.  He states that his pain has mostly improved, but he still gets left sided chest pain when he takes a deep breath.  He denies any history of ACS, PE, or DVT.  He has not taken anything for his symptoms.  He denies any cough or fever.    The history is provided by the patient. No language interpreter was used.    History reviewed. No pertinent past medical history. History reviewed. No pertinent past surgical history. No family history on file. Social History  Substance Use Topics  . Smoking status: Never Smoker   . Smokeless tobacco: None  . Alcohol Use: 4.8 oz/week    8 Cans of beer per week    Review of Systems  Constitutional: Negative for fever and chills.  Respiratory: Negative for cough and shortness of breath.   Cardiovascular: Positive for chest pain.  Gastrointestinal: Negative for nausea, vomiting, diarrhea and constipation.  Genitourinary: Negative for dysuria.  All other systems reviewed and are negative.     Allergies  Review of patient's allergies indicates no known allergies.  Home Medications   Prior to Admission medications   Not on File   BP 162/95 mmHg  Pulse 69  Temp(Src) 99.2 F (37.3 C) (Oral)  Resp 17  Ht 5\' 9"  (1.753 m)  Wt 89.812 kg  BMI 29.23 kg/m2  SpO2 98% Physical Exam  Constitutional: He is oriented to person, place, and time. He appears well-developed and well-nourished.  HENT:  Head: Normocephalic and atraumatic.  Eyes: Conjunctivae and EOM are normal. Pupils are equal, round, and  reactive to light. Right eye exhibits no discharge. Left eye exhibits no discharge. No scleral icterus.  Neck: Normal range of motion. Neck supple. No JVD present.  Cardiovascular: Normal rate, regular rhythm and normal heart sounds.  Exam reveals no gallop and no friction rub.   No murmur heard. Pulmonary/Chest: Effort normal and breath sounds normal. No respiratory distress. He has no wheezes. He has no rales. He exhibits no tenderness.  Abdominal: Soft. He exhibits no distension and no mass. There is no tenderness. There is no rebound and no guarding.  Musculoskeletal: Normal range of motion. He exhibits no edema or tenderness.  Neurological: He is alert and oriented to person, place, and time.  Skin: Skin is warm and dry.  Psychiatric: He has a normal mood and affect. His behavior is normal. Judgment and thought content normal.  Nursing note and vitals reviewed.   ED Course  Procedures (including critical care time) Labs Review Labs Reviewed  BASIC METABOLIC PANEL - Abnormal; Notable for the following:    Chloride 100 (*)    Glucose, Bld 124 (*)    All other components within normal limits  CBC - Abnormal; Notable for the following:    Hemoglobin 17.5 (*)    All other components within normal limits  D-DIMER, QUANTITATIVE (NOT AT [facility])  Rosezena SensorI-STAT TROPOININ, ED    Imaging Review Dg Chest 2 View  08/25/2015  CLINICAL DATA:  One day history of left upper chest  tightness EXAM: CHEST  2 VIEW COMPARISON:  July 10 2005 FINDINGS: There is minimal scarring in the left base. There is no edema or consolidation. Heart size and pulmonary vascularity are normal. No adenopathy. No pneumothorax. There is mild degenerative change in the thoracic spine. IMPRESSION: Mild scarring left base.  No edema or consolidation. Electronically Signed   By: Bretta Bang III M.D.   On: 08/25/2015 14:48   I have personally reviewed and evaluated these images and lab results as part of my medical  decision-making.   EKG Interpretation None      MDM   Final diagnoses:  Chest pain, unspecified chest pain type   Patient seen last night for CP.  Did not receive all results.  D-dimer returned after patient eloped.  D-dimer is >20.  Will check CT angio.  Discussed with Dr. Patria Mane, who recommends CT now rather than repeating d dimer.   CT is negative for PE.  Patient could have some pleurisy versus musculoskeletal chest wall pain. Will discharge to home with close follow-up. Recommend taking anti-inflammatory. Patient understands and agrees to plan. Repeat troponin today is negative, low suspicion for ACS.   Roxy Horseman, PA-C 08/26/15 1048  Azalia Bilis, MD 08/26/15 [id number]

## 2015-08-26 NOTE — ED Notes (Signed)
Pt. reports mid chest pain with mild SOB onset yesterday , denies nausea or diaphoresis , pain increases with deep inspiration , seen last night for the same complaint , denies pain at triage.

## 2015-08-26 NOTE — ED Notes (Signed)
Patient transported to CT 

## 2015-08-26 NOTE — ED Notes (Signed)
Patient has returned from being out of the department; patient placed back on monitor, continuous pulse oximetry and blood pressure cuff 

## 2015-08-26 NOTE — Discharge Instructions (Signed)
Chest Wall Pain °Chest wall pain is pain in or around the bones and muscles of your chest. Sometimes, an injury causes this pain. Sometimes, the cause may not be known. This pain may take several weeks or longer to get better. °HOME CARE INSTRUCTIONS  °Pay attention to any changes in your symptoms. Take these actions to help with your pain:  °· Rest as told by your health care provider.   °· Avoid activities that cause pain. These include any activities that use your chest muscles or your abdominal and side muscles to lift heavy items.    °· If directed, apply ice to the painful area: °· Put ice in a plastic bag. °· Place a towel between your skin and the bag. °· Leave the ice on for 20 minutes, 2-3 times per day. °· Take over-the-counter and prescription medicines only as told by your health care provider. °· Do not use tobacco products, including cigarettes, chewing tobacco, and e-cigarettes. If you need help quitting, ask your health care provider. °· Keep all follow-up visits as told by your health care provider. This is important. °SEEK MEDICAL CARE IF: °· You have a fever. °· Your chest pain becomes worse. °· You have new symptoms. °SEEK IMMEDIATE MEDICAL CARE IF: °· You have nausea or vomiting. °· You feel sweaty or light-headed. °· You have a cough with phlegm (sputum) or you cough up blood. °· You develop shortness of breath. °  °This information is not intended to replace advice given to you by your health care provider. Make sure you discuss any questions you have with your health care provider. °  °Document Released: 09/06/2005 Document Revised: 05/28/2015 Document Reviewed: 12/02/2014 °Elsevier Interactive Patient Education ©2016 Elsevier Inc. ° °Pleurisy °Pleurisy is an inflammation and swelling of the lining of the lungs (pleura). Because of this inflammation, it hurts to breathe. It can be aggravated by coughing, laughing, or deep breathing. Pleurisy is often caused by an underlying infection or  disease.  °HOME CARE INSTRUCTIONS  °Monitor your pleurisy for any changes. The following actions may help to alleviate any discomfort you are experiencing: °· Medicine may help with pain. Only take over-the-counter or prescription medicines for pain, discomfort, or fever as directed by your health care provider. °· Only take antibiotic medicine as directed. Make sure to finish it even if you start to feel better. °SEEK MEDICAL CARE IF:  °· Your pain is not controlled with medicine or is increasing. °· You have an increase in pus-like (purulent) secretions brought up with coughing. °SEEK IMMEDIATE MEDICAL CARE IF:  °· You have blue or dark lips, fingernails, or toenails. °· You are coughing up blood. °· You have increased difficulty breathing. °· You have continuing pain unrelieved by medicine or pain lasting more than 1 week. °· You have pain that radiates into your neck, arms, or jaw. °· You develop increased shortness of breath or wheezing. °· You develop a fever, rash, vomiting, fainting, or other serious symptoms. °MAKE SURE YOU: °· Understand these instructions.   °· Will watch your condition.   °· Will get help right away if you are not doing well or get worse. °  °  °This information is not intended to replace advice given to you by your health care provider. Make sure you discuss any questions you have with your health care provider. °  °Document Released: 09/06/2005 Document Revised: 05/09/2013 Document Reviewed: 02/18/2013 °Elsevier Interactive Patient Education ©2016 Elsevier Inc. ° °

## 2015-08-27 MED ORDER — IOHEXOL 350 MG/ML SOLN
100.0000 mL | Freq: Once | INTRAVENOUS | Status: AC | PRN
  Administered 2015-08-26: 100 mL via INTRAVENOUS

## 2016-01-08 ENCOUNTER — Ambulatory Visit: Payer: Worker's Compensation

## 2016-01-08 ENCOUNTER — Ambulatory Visit (INDEPENDENT_AMBULATORY_CARE_PROVIDER_SITE_OTHER): Payer: Worker's Compensation | Admitting: Family Medicine

## 2016-01-08 VITALS — BP 126/82 | HR 65 | Temp 97.8°F | Resp 16 | Ht 69.0 in | Wt 193.8 lb

## 2016-01-08 DIAGNOSIS — S63501A Unspecified sprain of right wrist, initial encounter: Secondary | ICD-10-CM

## 2016-01-08 DIAGNOSIS — M25531 Pain in right wrist: Secondary | ICD-10-CM | POA: Diagnosis not present

## 2016-01-08 NOTE — Patient Instructions (Addendum)
IF you received an x-ray today, you will receive an invoice from [facility]. Please contact [facility] at [phone] with questions or concerns regarding your invoice.   IF you received labwork today, you will receive an invoice from [employer]. Please contact [facility] at [phone] with questions or concerns regarding your invoice.   Our billing staff will not be able to assist you with questions regarding bills from these companies.  You will be contacted with the lab results as soon as they are available. The fastest way to get your results is to activate your My Chart account. Instructions are located on the last page of this paperwork. If you have not heard from [place] regarding the results in 2 weeks, please contact this office.     Wear wrist brace at work, out of brace only for range of motion if it is not causing pain, and recheck in 5 days. If still painful at that time, you may need to wear the brace for 2 weeks with repeat x-ray to make sure one of the bones is not broken. Over-the-counter Tylenol or Advil if needed for pain.  Wrist Sprain A wrist sprain is a stretch or tear in the strong, fibrous tissues (ligaments) that connect your wrist bones. The ligaments of your wrist may be easily sprained. There are three types of wrist sprains.  Grade 1. The ligament is not stretched or torn, but the sprain causes pain.  Grade 2. The ligament is stretched or partially torn. You may be able to move your wrist, but not very much.  Grade 3. The ligament or muscle completely tears. You may find it difficult or extremely painful to move your wrist even a little. CAUSES Often, wrist sprains are a result of a fall or an injury. The force of the impact causes the fibers of your ligament to stretch too much or tear. Common causes of wrist sprains include:  Overextending your wrist while catching a ball with your hands.  Repetitive or  strenuous extension or bending of your wrist.  Landing on your hand during a fall. RISK FACTORS  Having previous wrist injuries.  Playing contact sports, such as boxing or wrestling.  Participating in activities in which falling is common.  Having poor wrist strength and flexibility. SIGNS AND SYMPTOMS  Wrist pain.  Wrist tenderness.  Inflammation or bruising of the wrist area.  Hearing a "pop" or feeling a tear at the time of the injury.  Decreased wrist movement due to pain, stiffness, or weakness. DIAGNOSIS Your health care provider will examine your wrist. In some cases, an X-ray will be taken to make sure you did not break any bones. If your health care provider thinks that you tore a ligament, he or she may order an MRI of your wrist. TREATMENT Treatment involves resting and icing your wrist. You may also need to take pain medicines to help lessen pain and inflammation. Your health care provider may recommend keeping your wrist still (immobilized) with a splint to help your sprain heal. When the splint is no longer necessary, you may need to perform strengthening and stretching exercises. These exercises help you to regain strength and full range of motion in your wrist. Surgery is not usually needed for wrist sprains unless the ligament completely tears. HOME CARE INSTRUCTIONS  Rest your wrist. Do not do things that cause pain.  Wear your wrist splint as directed by your health care provider.  Take medicines only as  directed by your health care provider.  To ease pain and swelling, apply ice to the injured area.  Put ice in a plastic bag.  Place a towel between your skin and the bag.  Leave the ice on for 20 minutes, 2-3 times a day. SEEK MEDICAL CARE IF:  Your pain, discomfort, or swelling gets worse even with treatment.  You feel sudden numbness in your hand.   This information is not intended to replace advice given to you by your health care provider. Make  sure you discuss any questions you have with your health care provider.   Document Released: 05/10/2014 Document Reviewed: 05/10/2014 Elsevier Interactive Patient Education [employer]2016 Elsevier Inc.

## 2016-01-08 NOTE — Progress Notes (Addendum)
Subjective:  By signing my name below, I, Dennis Vega, attest that this documentation has been prepared under the direction and in the presence of Dennis Staggers, MD. Electronically Signed: Stann Vega, Scribe. 01/08/2016 , 12:37 PM .  Patient was seen in Room 13 .   Patient ID: Dennis Vega, male    DOB: [birth year], 55 y.o.   MRN: [id number] Chief Complaint  Patient presents with  . Hand Injury    hurt his hand while lifting some heay items while at work yesterday, x right hand sharp pain   HPI Dennis Vega is a 56 y.o. male Here for right hand pain while at work yesterday. He paints parts for different companies.   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 As he was lifting them, he felt a sharp pull towards the top of his right wrist. He continued to work and tried to block out the pain, but still felt persistent soreness over the area. He went into work this morning, and still felt soreness over the area with lifting. At around 8:30-9:00AM, the sharp pulling pain reoccurred when picking up trash to throw away. He denies applying ice over the area or taking any medications for this.   He's had fluid drained out over his right hand 15 years ago. He denies any issues with his right hand since.  He's right hand dominant.   No Known Allergies Prior to Admission medications   Not on File    Review of Systems  Constitutional: Negative for fever, chills and fatigue.  Musculoskeletal: Positive for myalgias and arthralgias. Negative for joint swelling, neck pain and neck stiffness.  Skin: Negative for rash and wound.  Neurological: Negative for dizziness, weakness, numbness and headaches.       Objective:   Physical Exam  Constitutional: He is oriented to person, place, and time. He appears well-developed and well-nourished. No distress.  HENT:  Head: Normocephalic and atraumatic.  Eyes: EOM are normal. Pupils are equal, round, and reactive to light.  Neck: Neck  supple.  Cardiovascular: Normal rate.   Pulmonary/Chest: Effort normal. No respiratory distress.  Musculoskeletal: Normal range of motion.  guarded with extension minimally, equal flexion, pain with ulnar deviation but equal rom otherwise, some prominence over distal radial styloid and tender at radialcarpal joint , minimal tenderness over scaphoid primarily dorsal wrist, grip strength normal, full rom of fingers, nvi distally  Neurological: He is alert and oriented to person, place, and time.  Skin: Skin is warm and dry.  Psychiatric: He has a normal mood and affect. His behavior is normal.  Nursing note and vitals reviewed.   Filed Vitals:   01/08/16 1147  BP: 126/82  Pulse: 65  Temp: 97.8 F (36.6 C)  TempSrc: Oral  Resp: 16  Height:  (1.753 m)  Weight: 193 lb 12.8 oz (87.907 kg)  SpO2: 98%   Dg Wrist Complete Right  01/08/2016  CLINICAL DATA:  Lifting injury yesterday with discomfort in the right wrist. EXAM: RIGHT WRIST - COMPLETE 3+ VIEW COMPARISON:  Right hand series of August 13, 2004 FINDINGS: The bones of the wrist are adequately mineralized. There is no acute fracture nor dislocation. The radiocarpal and ulnocarpal joints are intact. The intercarpal and carpometacarpal joints are within the limits of normal for age. The soft tissues are unremarkable. IMPRESSION: There is no acute bony abnormality of the right wrist. Electronically Signed   By: [name] M.D.   On: 01/08/2016 13:01      Assessment &  Plan:  Dennis Vega is a 55 y.o. male Right wrist pain - Plan: DG Wrist Complete Right, Thumb spica  Sprain of right wrist, initial encounter - Plan: DG Wrist Complete Right, Thumb spica  Right wrist sprain due to injury at work yesterday. Suspected sprain, but some tenderness over the scaphoid and distal radiocarpal joint. Will place in thumb spica splint, recheck in 5 days, then if persistent pain plan on splinting for 2 weeks with repeat x-ray at that time.,  tylenol or  Advil as needed over-the-counter, RTC precautions if worsening sooner. Letter for work provided.   No orders of the defined types were placed in this encounter.   Patient Instructions       IF you received an x-ray today, you will receive an invoice from [facility]. Please contact [facility] at [phone] with questions or concerns regarding your invoice.   IF you received labwork today, you will receive an invoice from [facility]. Please contact [facility] at [phone] with questions or concerns regarding your invoice.   Our billing staff will not be able to assist you with questions regarding bills from these companies.  You will be contacted with the lab results as soon as they are available. The fastest way to get your results is to activate your My Chart account. Instructions are located on the last page of this paperwork. If you have not heard from us regarding the results in 2 weeks, please contact this office.     Wear wrist brace at work, out of brace only for range of motion if it is not causing pain, and recheck in 5 days. If still painful at that time, you may need to wear the brace for 2 weeks with repeat x-ray to make sure one of the bones is not broken. Over-the-counter Tylenol or Advil if needed for pain.  Wrist Sprain A wrist sprain is a stretch or tear in the strong, fibrous tissues (ligaments) that connect your wrist bones. The ligaments of your wrist may be easily sprained. There are three types of wrist sprains.  Grade 1. The ligament is not stretched or torn, but the sprain causes pain.  Grade 2. The ligament is stretched or partially torn. You may be able to move your wrist, but not very much.  Grade 3. The ligament or muscle completely tears. You may find it difficult or extremely painful to move your wrist even a little. CAUSES Often, wrist sprains are a result of a fall or an injury. The force of  the impact causes the fibers of your ligament to stretch too much or tear. Common causes of wrist sprains include:  Overextending your wrist while catching a ball with your hands.  Repetitive or strenuous extension or bending of your wrist.  Landing on your hand during a fall. RISK FACTORS  Having previous wrist injuries.  Playing contact sports, such as boxing or wrestling.  Participating in activities in which falling is common.  Having poor wrist strength and flexibility. SIGNS AND SYMPTOMS  Wrist pain.  Wrist tenderness.  Inflammation or bruising of the wrist area.  Hearing a "pop" or feeling a tear at the time of the injury.  Decreased wrist movement due to pain, stiffness, or weakness. DIAGNOSIS Your health care provider will examine your wrist. In some cases, an X-ray will be taken to make sure you did not break any bones. If your health care provider thinks that you tore a ligament, he or she may order an  MRI of your wrist. TREATMENT Treatment involves resting and icing your wrist. You may also need to take pain medicines to help lessen pain and inflammation. Your health care provider may recommend keeping your wrist still (immobilized) with a splint to help your sprain heal. When the splint is no longer necessary, you may need to perform strengthening and stretching exercises. These exercises help you to regain strength and full range of motion in your wrist. Surgery is not usually needed for wrist sprains unless the ligament completely tears. HOME CARE INSTRUCTIONS  Rest your wrist. Do not do things that cause pain.  Wear your wrist splint as directed by your health care provider.  Take medicines only as directed by your health care provider.  To ease pain and swelling, apply ice to the injured area.  Put ice in a plastic bag.  Place a towel between your skin and the bag.  Leave the ice on for 20 minutes, 2-3 times a day. SEEK MEDICAL CARE IF:  Your pain,  discomfort, or swelling gets worse even with treatment.  You feel sudden numbness in your hand.   This information is not intended to replace advice given to you by your health care provider. Make sure you discuss any questions you have with your health care provider.   Document Released: 05/10/2014 Document Reviewed: 05/10/2014 Elsevier Interactive Patient Education [employer]     I personally performed the services described in this documentation, which was scribed in my presence. The recorded information has been reviewed and considered, and addended by me as needed.

## 2016-01-13 ENCOUNTER — Ambulatory Visit (INDEPENDENT_AMBULATORY_CARE_PROVIDER_SITE_OTHER): Payer: Worker's Compensation | Admitting: Family Medicine

## 2016-01-13 VITALS — BP 140/88 | HR 68 | Temp 98.4°F | Resp 16 | Ht 69.0 in | Wt 198.2 lb

## 2016-01-13 DIAGNOSIS — S63501D Unspecified sprain of right wrist, subsequent encounter: Secondary | ICD-10-CM | POA: Diagnosis not present

## 2016-01-13 NOTE — Patient Instructions (Addendum)
IF you received an x-ray today, you will receive an invoice from [facility]. Please contact [facility] at [phone] with questions or concerns regarding your invoice.   IF you received labwork today, you will receive an invoice from [employer]. Please contact [facility] at [phone] with questions or concerns regarding your invoice.   Our billing staff will not be able to assist you with questions regarding bills from these companies.  You will be contacted with the lab results as soon as they are available. The fastest way to get your results is to activate your My Chart account. Instructions are located on the last page of this paperwork. If you have not heard from [place] regarding the results in 2 weeks, please contact this office.    As you are having no pain and good strength on exam today, we can try full duty, no restrictions. Follow-up with me in the next 1 week and if still tolerating full duty without pain at that time, anticipate you will not need further visits after that one.   Occasional Aleve if needed for discomfort, but if persistent pain or discomfort, return to using the brace and follow-up sooner.  Wrist Sprain With Rehab A sprain is an injury in which a ligament that maintains the proper alignment of a joint is partially or completely torn. The ligaments of the wrist are susceptible to sprains. Sprains are classified into three categories. Grade 1 sprains cause pain, but the tendon is not lengthened. Grade 2 sprains include a lengthened ligament because the ligament is stretched or partially ruptured. With grade 2 sprains there is still function, although the function may be diminished. Grade 3 sprains are characterized by a complete tear of the tendon or muscle, and function is usually impaired. SYMPTOMS   Pain tenderness, inflammation, and/or bruising (contusion) of the injury.  A "pop" or tear felt and/or heard at the  time of injury.  Decreased wrist function. CAUSES  A wrist sprain occurs when a force is placed on one or more ligaments that is greater than it/they can withstand. Common mechanisms of injury include:  Catching a ball with your hands.  Repetitive and/ or strenuous extension or flexion of the wrist. RISK INCREASES WITH:  Previous wrist injury.  Contact sports (boxing or wrestling).  Activities in which falling is common.  Poor strength and flexibility.  Improperly fitted or padded protective equipment. PREVENTION  Warm up and stretch properly before activity.  Allow for adequate recovery between workouts.  Maintain physical fitness:  Strength, flexibility, and endurance.  Cardiovascular fitness.  Protect the wrist joint by limiting its motion with the use of taping, braces, or splints.  Protect the wrist after injury for 6 to 12 months. PROGNOSIS  The prognosis for wrist sprains depends on the degree of injury. Grade 1 sprains require 2 to 6 weeks of treatment. Grade 2 sprains require 6 to 8 weeks of treatment, and grade 3 sprains require up to 12 weeks.  RELATED COMPLICATIONS   Prolonged healing time, if improperly treated or re-injured.  Recurrent symptoms that result in a chronic problem.  Injury to nearby structures (bone, cartilage, nerves, or tendons).  Arthritis of the wrist.  Inability to compete in athletics at a high level.  Wrist stiffness or weakness.  Progression to a complete rupture of the ligament. TREATMENT  Treatment initially involves resting from any activities that aggravate the symptoms, and the use of ice and medications to help reduce pain and  inflammation. Your caregiver may recommend immobilizing the wrist for a period of time in order to reduce stress on the ligament and allow for healing. After immobilization it is important to perform strengthening and stretching exercises to help regain strength and a full range of motion. These  exercises may be completed at home or with a therapist. Surgery is not usually required for wrist sprains, unless the ligament has been ruptured (grade 3 sprain). MEDICATION   If pain medication is necessary, then nonsteroidal anti-inflammatory medications, such as aspirin and ibuprofen, or other minor pain relievers, such as acetaminophen, are often recommended.  Do not take pain medication for 7 days before surgery.  Prescription pain relievers may be given if deemed necessary by your caregiver. Use only as directed and only as much as you need. HEAT AND COLD  Cold treatment (icing) relieves pain and reduces inflammation. Cold treatment should be applied for 10 to 15 minutes every 2 to 3 hours for inflammation and pain and immediately after any activity that aggravates your symptoms. Use ice packs or massage the area with a piece of ice (ice massage).  Heat treatment may be used prior to performing the stretching and strengthening activities prescribed by your caregiver, physical therapist, or athletic trainer. Use a heat pack or soak your injury in warm water. SEEK MEDICAL CARE IF:  Treatment seems to offer no benefit, or the condition worsens.  Any medications produce adverse side effects. EXERCISES RANGE OF MOTION (ROM) AND STRETCHING EXERCISES - Wrist Sprain  These exercises may help you when beginning to rehabilitate your injury. Your symptoms may resolve with or without further involvement from your physician, physical therapist or athletic trainer. While completing these exercises, remember:   Restoring tissue flexibility helps normal motion to return to the joints. This allows healthier, less painful movement and activity.  An effective stretch should be held for at least 30 seconds.  A stretch should never be painful. You should only feel a gentle lengthening or release in the stretched tissue. RANGE OF MOTION - Wrist Flexion, Active-Assisted  Extend your right / left elbow  with your fingers pointing down.*  Gently pull the back of your hand towards you until you feel a gentle stretch on the top of your forearm.  Hold this position for __________ seconds. Repeat __________ times. Complete this exercise __________ times per day.  *If directed by your physician, physical therapist or athletic trainer, complete this stretch with your elbow bent rather than extended. RANGE OF MOTION - Wrist Extension, Active-Assisted  Extend your right / left elbow and turn your palm upwards.*  Gently pull your palm/fingertips back so your wrist extends and your fingers point more toward the ground.  You should feel a gentle stretch on the inside of your forearm.  Hold this position for __________ seconds. Repeat __________ times. Complete this exercise __________ times per day. *If directed by your physician, physical therapist or athletic trainer, complete this stretch with your elbow bent, rather than extended. RANGE OF MOTION - Supination, Active  Stand or sit with your elbows at your side. Bend your right / left elbow to 90 degrees.  Turn your palm upward until you feel a gentle stretch on the inside of your forearm.  Hold this position for __________ seconds. Slowly release and return to the starting position. Repeat __________ times. Complete this stretch __________ times per day.  RANGE OF MOTION - Pronation, Active  Stand or sit with your elbows at your side. Bend your right /  left elbow to 90 degrees.  Turn your palm downward until you feel a gentle stretch on the top of your forearm.  Hold this position for __________ seconds. Slowly release and return to the starting position. Repeat __________ times. Complete this stretch __________ times per day.  STRETCH - Wrist Flexion  Place the back of your right / left hand on a tabletop leaving your elbow slightly bent. Your fingers should point away from your body.  Gently press the back of your hand down onto the  table by straightening your elbow. You should feel a stretch on the top of your forearm.  Hold this position for __________ seconds. Repeat __________ times. Complete this stretch __________ times per day.  STRETCH - Wrist Extension  Place your right / left fingertips on a tabletop leaving your elbow slightly bent. Your fingers should point backwards.  Gently press your fingers and palm down onto the table by straightening your elbow. You should feel a stretch on the inside of your forearm.  Hold this position for __________ seconds. Repeat __________ times. Complete this stretch __________ times per day.  STRENGTHENING EXERCISES - Wrist Sprain These exercises may help you when beginning to rehabilitate your injury. They may resolve your symptoms with or without further involvement from your physician, physical therapist or athletic trainer. While completing these exercises, remember:   Muscles can gain both the endurance and the strength needed for everyday activities through controlled exercises.  Complete these exercises as instructed by your physician, physical therapist or athletic trainer. Progress with the resistance and repetition exercises only as your caregiver advises. STRENGTH - Wrist Flexors  Sit with your right / left forearm palm-up and fully supported. Your elbow should be resting below the height of your shoulder. Allow your wrist to extend over the edge of the surface.  Loosely holding a __________ weight or a piece of rubber exercise band/tubing, slowly curl your hand up toward your forearm.  Hold this position for __________ seconds. Slowly lower the wrist back to the starting position in a controlled manner. Repeat __________ times. Complete this exercise __________ times per day.  STRENGTH - Wrist Extensors  Sit with your right / left forearm palm-down and fully supported. Your elbow should be resting below the height of your shoulder. Allow your wrist to extend over  the edge of the surface.  Loosely holding a __________ weight or a piece of rubber exercise band/tubing, slowly curl your hand up toward your forearm.  Hold this position for __________ seconds. Slowly lower the wrist back to the starting position in a controlled manner. Repeat __________ times. Complete this exercise __________ times per day.  STRENGTH - Ulnar Deviators  Stand with a ____________________ weight in your right / left hand, or sit holding on to the rubber exercise band/tubing with your opposite arm supported.  Move your wrist so that your pinkie travels toward your forearm and your thumb moves away from your forearm.  Hold this position for __________ seconds and then slowly lower the wrist back to the starting position. Repeat __________ times. Complete this exercise __________ times per day STRENGTH - Radial Deviators  Stand with a ____________________ weight in your  right / left hand, or sit holding on to the rubber exercise band/tubing with your arm supported.  Raise your hand upward in front of you or pull up on the rubber tubing.  Hold this position for __________ seconds and then slowly lower the wrist back to the starting position. Repeat __________  times. Complete this exercise __________ times per day. STRENGTH - Forearm Supinators  Sit with your right / left forearm supported on a table, keeping your elbow below shoulder height. Rest your hand over the edge, palm down.  Gently grip a hammer or a soup ladle.  Without moving your elbow, slowly turn your palm and hand upward to a "thumbs-up" position.  Hold this position for __________ seconds. Slowly return to the starting position. Repeat __________ times. Complete this exercise __________ times per day.  STRENGTH - Forearm Pronators  Sit with your right / left forearm supported on a table, keeping your elbow below shoulder height. Rest your hand over the edge, palm up.  Gently grip a hammer or a soup  ladle.  Without moving your elbow, slowly turn your palm and hand upward to a "thumbs-up" position.  Hold this position for __________ seconds. Slowly return to the starting position. Repeat __________ times. Complete this exercise __________ times per day.  STRENGTH - Grip  Grasp a tennis ball, a dense sponge, or a large, rolled sock in your hand.  Squeeze as hard as you can without increasing any pain.  Hold this position for __________ seconds. Release your grip slowly. Repeat __________ times. Complete this exercise __________ times per day.    This information is not intended to replace advice given to you by your health care provider. Make sure you discuss any questions you have with your health care provider.   Document Released: 09/06/2005 Document Revised: 05/28/2015 Document Reviewed: 12/19/2008 [employer] Interactive Patient Education [employer]

## 2016-01-13 NOTE — Progress Notes (Signed)
Subjective:  By signing my name below, I, [name], attest that this documentation has been prepared under the direction and in the presence of [name], MD. Electronically Signed: [name], Scribe. 01/13/2016 , 11:40 AM .  Patient was seen in Room 12 .   Patient ID: Dennis Vega, male    DOB: [birth year], 55 y.o.   MRN: [id number] Chief Complaint  Patient presents with  . Follow-up    right wrist, WC   HPI Dennis Vega is a 55 y.o. male Here for follow up on right wrist injury that occurred at work. Date of injury 01/07/16. Suspected right wrist sprain but some discomfort over scaphoid, placed in thumb spica splint.   Patient states his hand is feeling better. He's been wearing his wrist brace all the time including when he goes to sleep. The only time he takes it off is when he's driving. He's been taking aleve for the pain. He informs not going back to work due to the restrictions.   He reports feeling 100% better. He believes he's ready to return to work for full duty.   No Known Allergies Prior to Admission medications   Not on File    Review of Systems  Constitutional: Negative for fever, chills and fatigue.  Gastrointestinal: Negative for nausea and vomiting.  Musculoskeletal: Negative for myalgias and arthralgias.  Skin: Negative for rash and wound.  Neurological: Negative for dizziness, weakness, numbness and headaches.       Objective:   Physical Exam  Constitutional: He is oriented to person, place, and time. He appears well-developed and well-nourished. No distress.  HENT:  Head: Normocephalic and atraumatic.  Eyes: EOM are normal. Pupils are equal, round, and reactive to light.  Neck: Neck supple.  Cardiovascular: Normal rate.   Pulmonary/Chest: Effort normal. No respiratory distress.  Musculoskeletal: Normal range of motion.  Right wrist: skin intact, slight decrease extension of wrist, slight decreased flexion, slight decreased radial  deviation, equal ulnar deviation; no focal bony tenderness including scaphoid which is non tender; full strength, pain free strength testing to wrist, no palpable tenderness  Neurological: He is alert and oriented to person, place, and time.  Skin: Skin is warm and dry.  Psychiatric: He has a normal mood and affect. His behavior is normal.  Nursing note and vitals reviewed.   Filed Vitals:   01/13/16 1019 01/13/16 1043  BP: 168/105 140/88  Pulse: 68   Temp: 98.4 F (36.9 C)   TempSrc: Oral   Resp: 16   Height:  (1.753 m)   Weight: 198 lb 3.2 oz (89.903 kg)   SpO2: 98%       Assessment & Plan:  Dennis Vega is a 55 y.o. male Right wrist sprain, subsequent encounter  Improved, reportedly 100% improved at this time. Full strength, minimal decreased range of motion. Would like to return to full duty.  - Handout given with exercises for increased range of motion, but will try full duty, recheck in 1 week, anticipate MMI at that time if still doing well.   -Aleve if needed for intermittent discomfort, but if more painful, return to brace and return sooner for change in restrictions.  - note for work provided  No orders of the defined types were placed in this encounter.   Patient Instructions       IF you received an x-ray today, you will receive an invoice from [facility]. Please contact [facility] at [phone] with questions or concerns regarding your  invoice.   IF you received labwork today, you will receive an invoice from [employer]. Please contact [facility] at [phone] with questions or concerns regarding your invoice.   Our billing staff will not be able to assist you with questions regarding bills from these companies.  You will be contacted with the lab results as soon as they are available. The fastest way to get your results is to activate your My Chart account. Instructions are located on the last page of  this paperwork. If you have not heard from [place] regarding the results in 2 weeks, please contact this office.    As you are having no pain and good strength on exam today, we can try full duty, no restrictions. Follow-up with me in the next 1 week and if still tolerating full duty without pain at that time, anticipate you will not need further visits after that one.   Occasional Aleve if needed for discomfort, but if persistent pain or discomfort, return to using the brace and follow-up sooner.  Wrist Sprain With Rehab A sprain is an injury in which a ligament that maintains the proper alignment of a joint is partially or completely torn. The ligaments of the wrist are susceptible to sprains. Sprains are classified into three categories. Grade 1 sprains cause pain, but the tendon is not lengthened. Grade 2 sprains include a lengthened ligament because the ligament is stretched or partially ruptured. With grade 2 sprains there is still function, although the function may be diminished. Grade 3 sprains are characterized by a complete tear of the tendon or muscle, and function is usually impaired. SYMPTOMS   Pain tenderness, inflammation, and/or bruising (contusion) of the injury.  A "pop" or tear felt and/or heard at the time of injury.  Decreased wrist function. CAUSES  A wrist sprain occurs when a force is placed on one or more ligaments that is greater than it/they can withstand. Common mechanisms of injury include:  Catching a ball with your hands.  Repetitive and/ or strenuous extension or flexion of the wrist. RISK INCREASES WITH:  Previous wrist injury.  Contact sports (boxing or wrestling).  Activities in which falling is common.  Poor strength and flexibility.  Improperly fitted or padded protective equipment. PREVENTION  Warm up and stretch properly before activity.  Allow for adequate recovery between workouts.  Maintain physical fitness:  Strength, flexibility, and  endurance.  Cardiovascular fitness.  Protect the wrist joint by limiting its motion with the use of taping, braces, or splints.  Protect the wrist after injury for 6 to 12 months. PROGNOSIS  The prognosis for wrist sprains depends on the degree of injury. Grade 1 sprains require 2 to 6 weeks of treatment. Grade 2 sprains require 6 to 8 weeks of treatment, and grade 3 sprains require up to 12 weeks.  RELATED COMPLICATIONS   Prolonged healing time, if improperly treated or re-injured.  Recurrent symptoms that result in a chronic problem.  Injury to nearby structures (bone, cartilage, nerves, or tendons).  Arthritis of the wrist.  Inability to compete in athletics at a high level.  Wrist stiffness or weakness.  Progression to a complete rupture of the ligament. TREATMENT  Treatment initially involves resting from any activities that aggravate the symptoms, and the use of ice and medications to help reduce pain and inflammation. Your caregiver may recommend immobilizing the wrist for a period of time in order to reduce stress on the ligament and allow for healing. After immobilization it  is important to perform strengthening and stretching exercises to help regain strength and a full range of motion. These exercises may be completed at home or with a therapist. Surgery is not usually required for wrist sprains, unless the ligament has been ruptured (grade 3 sprain). MEDICATION   If pain medication is necessary, then nonsteroidal anti-inflammatory medications, such as aspirin and ibuprofen, or other minor pain relievers, such as acetaminophen, are often recommended.  Do not take pain medication for 7 days before surgery.  Prescription pain relievers may be given if deemed necessary by your caregiver. Use only as directed and only as much as you need. HEAT AND COLD  Cold treatment (icing) relieves pain and reduces inflammation. Cold treatment should be applied for 10 to 15 minutes every  2 to 3 hours for inflammation and pain and immediately after any activity that aggravates your symptoms. Use ice packs or massage the area with a piece of ice (ice massage).  Heat treatment may be used prior to performing the stretching and strengthening activities prescribed by your caregiver, physical therapist, or athletic trainer. Use a heat pack or soak your injury in warm water. SEEK MEDICAL CARE IF:  Treatment seems to offer no benefit, or the condition worsens.  Any medications produce adverse side effects. EXERCISES RANGE OF MOTION (ROM) AND STRETCHING EXERCISES - Wrist Sprain  These exercises may help you when beginning to rehabilitate your injury. Your symptoms may resolve with or without further involvement from your physician, physical therapist or athletic trainer. While completing these exercises, remember:   Restoring tissue flexibility helps normal motion to return to the joints. This allows healthier, less painful movement and activity.  An effective stretch should be held for at least 30 seconds.  A stretch should never be painful. You should only feel a gentle lengthening or release in the stretched tissue. RANGE OF MOTION - Wrist Flexion, Active-Assisted  Extend your right / left elbow with your fingers pointing down.*  Gently pull the back of your hand towards you until you feel a gentle stretch on the top of your forearm.  Hold this position for __________ seconds. Repeat __________ times. Complete this exercise __________ times per day.  *If directed by your physician, physical therapist or athletic trainer, complete this stretch with your elbow bent rather than extended. RANGE OF MOTION - Wrist Extension, Active-Assisted  Extend your right / left elbow and turn your palm upwards.*  Gently pull your palm/fingertips back so your wrist extends and your fingers point more toward the ground.  You should feel a gentle stretch on the inside of your forearm.  Hold  this position for __________ seconds. Repeat __________ times. Complete this exercise __________ times per day. *If directed by your physician, physical therapist or athletic trainer, complete this stretch with your elbow bent, rather than extended. RANGE OF MOTION - Supination, Active  Stand or sit with your elbows at your side. Bend your right / left elbow to 90 degrees.  Turn your palm upward until you feel a gentle stretch on the inside of your forearm.  Hold this position for __________ seconds. Slowly release and return to the starting position. Repeat __________ times. Complete this stretch __________ times per day.  RANGE OF MOTION - Pronation, Active  Stand or sit with your elbows at your side. Bend your right / left elbow to 90 degrees.  Turn your palm downward until you feel a gentle stretch on the top of your forearm.  Hold this position for  __________ seconds. Slowly release and return to the starting position. Repeat __________ times. Complete this stretch __________ times per day.  STRETCH - Wrist Flexion  Place the back of your right / left hand on a tabletop leaving your elbow slightly bent. Your fingers should point away from your body.  Gently press the back of your hand down onto the table by straightening your elbow. You should feel a stretch on the top of your forearm.  Hold this position for __________ seconds. Repeat __________ times. Complete this stretch __________ times per day.  STRETCH - Wrist Extension  Place your right / left fingertips on a tabletop leaving your elbow slightly bent. Your fingers should point backwards.  Gently press your fingers and palm down onto the table by straightening your elbow. You should feel a stretch on the inside of your forearm.  Hold this position for __________ seconds. Repeat __________ times. Complete this stretch __________ times per day.  STRENGTHENING EXERCISES - Wrist Sprain These exercises may help you when  beginning to rehabilitate your injury. They may resolve your symptoms with or without further involvement from your physician, physical therapist or athletic trainer. While completing these exercises, remember:   Muscles can gain both the endurance and the strength needed for everyday activities through controlled exercises.  Complete these exercises as instructed by your physician, physical therapist or athletic trainer. Progress with the resistance and repetition exercises only as your caregiver advises. STRENGTH - Wrist Flexors  Sit with your right / left forearm palm-up and fully supported. Your elbow should be resting below the height of your shoulder. Allow your wrist to extend over the edge of the surface.  Loosely holding a __________ weight or a piece of rubber exercise band/tubing, slowly curl your hand up toward your forearm.  Hold this position for __________ seconds. Slowly lower the wrist back to the starting position in a controlled manner. Repeat __________ times. Complete this exercise __________ times per day.  STRENGTH - Wrist Extensors  Sit with your right / left forearm palm-down and fully supported. Your elbow should be resting below the height of your shoulder. Allow your wrist to extend over the edge of the surface.  Loosely holding a __________ weight or a piece of rubber exercise band/tubing, slowly curl your hand up toward your forearm.  Hold this position for __________ seconds. Slowly lower the wrist back to the starting position in a controlled manner. Repeat __________ times. Complete this exercise __________ times per day.  STRENGTH - Ulnar Deviators  Stand with a ____________________ weight in your right / left hand, or sit holding on to the rubber exercise band/tubing with your opposite arm supported.  Move your wrist so that your pinkie travels toward your forearm and your thumb moves away from your forearm.  Hold this position for __________ seconds and  then slowly lower the wrist back to the starting position. Repeat __________ times. Complete this exercise __________ times per day STRENGTH - Radial Deviators  Stand with a ____________________ weight in your  right / left hand, or sit holding on to the rubber exercise band/tubing with your arm supported.  Raise your hand upward in front of you or pull up on the rubber tubing.  Hold this position for __________ seconds and then slowly lower the wrist back to the starting position. Repeat __________ times. Complete this exercise __________ times per day. STRENGTH - Forearm Supinators  Sit with your right / left forearm supported on a table, keeping your elbow  below shoulder height. Rest your hand over the edge, palm down.  Gently grip a hammer or a soup ladle.  Without moving your elbow, slowly turn your palm and hand upward to a "thumbs-up" position.  Hold this position for __________ seconds. Slowly return to the starting position. Repeat __________ times. Complete this exercise __________ times per day.  STRENGTH - Forearm Pronators  Sit with your right / left forearm supported on a table, keeping your elbow below shoulder height. Rest your hand over the edge, palm up.  Gently grip a hammer or a soup ladle.  Without moving your elbow, slowly turn your palm and hand upward to a "thumbs-up" position.  Hold this position for __________ seconds. Slowly return to the starting position. Repeat __________ times. Complete this exercise __________ times per day.  STRENGTH - Grip  Grasp a tennis ball, a dense sponge, or a large, rolled sock in your hand.  Squeeze as hard as you can without increasing any pain.  Hold this position for __________ seconds. Release your grip slowly. Repeat __________ times. Complete this exercise __________ times per day.    This information is not intended to replace advice given to you by your health care provider. Make sure you discuss any questions  you have with your health care provider.   Document Released: 09/06/2005 Document Revised: 05/28/2015 Document Reviewed: 12/19/2008 Elsevier Interactive Patient Education [employer]

## 2016-01-20 ENCOUNTER — Ambulatory Visit (INDEPENDENT_AMBULATORY_CARE_PROVIDER_SITE_OTHER): Payer: Worker's Compensation | Admitting: Family Medicine

## 2016-01-20 VITALS — BP 130/88 | HR 70 | Temp 98.1°F | Resp 18 | Ht 69.0 in | Wt 194.4 lb

## 2016-01-20 DIAGNOSIS — S63501D Unspecified sprain of right wrist, subsequent encounter: Secondary | ICD-10-CM

## 2016-01-20 NOTE — Progress Notes (Signed)
Subjective:    Patient ID: Dennis BordersRicky Furness, male    DOB: 09/12/1961, 55 y.o.   MRN: [id number]  HPI Chief Complaint  Patient presents with  . Follow-up    right wrist, W/C    HPI Comments: Dennis BordersRicky Dayal is a 55 y.o. male who presents to the [facility] for a follow-up for wrist injury that occurred at work. Date of accident 01/07/16 suspected sprain. X-Ray w/o acute findings. Place in thumb spica splint. Has tinder scaphoid on initial evaluation on April 20th as well as work restrictions. Much improved, rechecked 5 days later. Reportedly 100%. No further pain on exam. Return to full duty and is here for recheck of trial on full duty.  Today pt reports feeling good, but still hasn't done any heavy lifting. He's had full ROM doing nl job activities without difficulty. He denies taking any medications for pain.   Filed Vitals:   01/20/16 1603  BP: 130/88  Pulse: 70  Temp: 98.1 F (36.7 C)  TempSrc: Oral  Resp: 18  Height: 5\' 9"  (1.753 m)  Weight: 194 lb 6.4 oz (88.179 kg)  SpO2: 98%   No Known Allergies  Prior to Admission medications   Not on File     Prior to Admission medications   Not on File   Social History   Social History  . Marital Status: Single    Spouse Name: N/A  . Number of Children: N/A  . Years of Education: N/A   Occupational History  . Not on file.   Social History Main Topics  . Smoking status: Never Smoker   . Smokeless tobacco: Not on file  . Alcohol Use: 4.8 oz/week    8 Cans of beer per week  . Drug Use: No  . Sexual Activity: Not on file   Other Topics Concern  . Not on file   Social History Narrative   Review of Systems  Constitutional: Negative for fatigue and unexpected weight change.  Eyes: Negative for visual disturbance.  Respiratory: Negative for cough, chest tightness and shortness of breath.   Cardiovascular: Negative for chest pain, palpitations and leg swelling.  Gastrointestinal: Negative for  abdominal pain and blood in stool.  Neurological: Negative for dizziness, light-headedness and headaches.     Objective:   Physical Exam  Constitutional: He is oriented to person, place, and time. He appears well-developed and well-nourished.  HENT:  Head: Normocephalic and atraumatic.  Eyes: EOM are normal. Pupils are equal, round, and reactive to light.  Neck: No JVD present. Carotid bruit is not present.  Cardiovascular: Normal rate, regular rhythm and normal heart sounds.   No murmur heard. Pulmonary/Chest: Effort normal and breath sounds normal. He has no rales.  Musculoskeletal: He exhibits no edema.  Right wrist: FROM skin intact no bony tenderness Pain free with resisted strength testing  Neurological: He is alert and oriented to person, place, and time.  Skin: Skin is warm and dry.  Psychiatric: He has a normal mood and affect.  Vitals reviewed.   Assessment & Plan:  Dennis Vega is a 55 y.o. male Right wrist sprain, subsequent encounter  - Still feels 100% improved. Tolerated full dutyat work. Will release for full duty, maximal medical improvement based on today's exam and history. No further visits as needed as long as his symptoms do not return. RTC precautions given.  No orders of the defined types were placed in this encounter.   Patient Instructions  IF you received an x-ray today, you will receive an invoice from [facility]. Please contact [facility] at [phone] with questions or concerns regarding your invoice.   IF you received labwork today, you will receive an invoice from [employer]. Please contact [facility] at [phone] with questions or concerns regarding your invoice.   Our billing staff will not be able to assist you with questions regarding bills from these companies.  You will be contacted with the lab results as soon as they are available. The fastest way to get your results is to  activate your My Chart account. Instructions are located on the last page of this paperwork. If you have not heard from [place] regarding the results in 2 weeks, please contact this office.     Your wrist sprain appears to be healed, and as you have tolerated full duty without discomfort, no further visits needed unless reinjury of that wrist. See paper for work. Let us know if you have any questions.  Return to the clinic or go to the nearest emergency room if any of your symptoms worsen or new symptoms occur.     I personally performed the services described in this documentation, which was scribed in my presence. The recorded information has been reviewed and considered, and addended by me as needed.

## 2016-01-20 NOTE — Patient Instructions (Addendum)
     IF you received an x-ray today, you will receive an [facility]. [facility] at [phone] with questions or concerns regarding your invoice.   IF you received labwork today, you will receive an invoice from [facility]. Please contact [facility] at [phone] with questions or concerns regarding your invoice.   Our billing staff will not be able to assist you with questions regarding bills from these companies.  You will be contacted with the lab results as soon as they are available. The fastest way to get your results is to activate your My Chart account. Instructions are located on the last page of this paperwork. If you have not heard from us regarding the results in 2 weeks, please contact this office.     Your wrist sprain appears to be healed, and as you have tolerated full duty without discomfort, no further visits needed unless reinjury of that wrist. See paper for work. Let us know if you have any questions.  Return to the clinic or go to the nearest emergency room if any of your symptoms worsen or new symptoms occur.

## 2018-02-23 ENCOUNTER — Encounter: Payer: Self-pay | Admitting: Physician Assistant

## 2018-02-23 ENCOUNTER — Ambulatory Visit (INDEPENDENT_AMBULATORY_CARE_PROVIDER_SITE_OTHER): Payer: Worker's Compensation | Admitting: Physician Assistant

## 2018-02-23 VITALS — BP 150/80 | HR 79 | Temp 98.0°F | Resp 18 | Ht 69.0 in | Wt 208.6 lb

## 2018-02-23 DIAGNOSIS — M545 Low back pain, unspecified: Secondary | ICD-10-CM

## 2018-02-23 DIAGNOSIS — Z026 Encounter for examination for insurance purposes: Secondary | ICD-10-CM

## 2018-02-23 MED ORDER — NAPROXEN 500 MG PO TABS: 500.0000 mg | ORAL_TABLET | Freq: Two times a day (BID) | ORAL | 0 refills | Status: DC

## 2018-02-23 MED ORDER — CYCLOBENZAPRINE HCL 10 MG PO TABS: 5.0000 mg | ORAL_TABLET | Freq: Three times a day (TID) | ORAL | 0 refills | Status: DC | PRN

## 2018-02-23 NOTE — Progress Notes (Signed)
02/23/2018 2:14 PM   DOB: November 29, 1960 / MRN: [id number]  SUBJECTIVE:  Dennis Vega is a 57 y.o. male presenting for left sided low back pain that he describes as sharp.  States in began when he was crawling under an apparatus in order to paint the hydraulic lift for a bucket on a bucket truck.  Denies bowel and bladder incontinence. No weakness or paresthesia. No previous history of back pain.   He has No Known Allergies.   He  has no past medical history on file.    He  reports that he has never smoked. He has never used smokeless tobacco. He reports that he drinks about 4.8 oz of alcohol per week. He reports that he does not use drugs. He  has no sexual activity history on file. The patient  has no past surgical history on file.  His family history includes Stroke in his mother.  Review of Systems  Constitutional: Negative for chills, diaphoresis and fever.  Eyes: Negative.   Respiratory: Negative for cough, hemoptysis, sputum production, shortness of breath and wheezing.   Cardiovascular: Negative for chest pain, orthopnea and leg swelling.  Gastrointestinal: Negative for nausea.  Musculoskeletal: Positive for back pain. Negative for falls, joint pain, myalgias and neck pain.  Skin: Negative for rash.  Neurological: Negative for dizziness, sensory change, speech change, focal weakness and headaches.    The problem list and medications were reviewed and updated by myself where necessary and exist elsewhere in the encounter.   OBJECTIVE:  BP (!) 150/80   Pulse 79   Temp 98 F (36.7 C) (Oral)   Resp 18   Ht 5\' 9"  (1.753 m)   Wt 208 lb 9.6 oz (94.6 kg)   SpO2 99%   BMI 30.80 kg/m   Physical Exam  Constitutional: He is oriented to person, place, and time. He appears well-developed. He is active.  Non-toxic appearance. He does not appear ill.  Eyes: Pupils are equal, round, and reactive to light. Conjunctivae and EOM are normal.  Cardiovascular: Normal rate, regular rhythm,  S1 normal, S2 normal, normal heart sounds, intact distal pulses and normal pulses. Exam reveals no gallop and no friction rub.  No murmur heard. Pulmonary/Chest: Effort normal. No stridor. No respiratory distress. He has no wheezes. He has no rales.  Abdominal: He exhibits no distension.  Musculoskeletal: Normal range of motion. He exhibits no edema.       Lumbar back: He exhibits tenderness and spasm. He exhibits normal range of motion, no bony tenderness and no swelling.       Back:  Neurological: He is alert and oriented to person, place, and time. No cranial nerve deficit. Coordination normal.  Skin: Skin is warm and dry. He is not diaphoretic. No pallor.  Psychiatric: He has a normal mood and affect.  Nursing note and vitals reviewed.   No results found for this or any previous visit (from the past 72 hour(s)).  No results found.  ASSESSMENT AND PLAN:  Dennis Vega was seen today for back pain.  Diagnoses and all orders for this visit:  Acute left-sided low back pain without sciatica: MIld injury.  NSAID and flexeril. RTC on 6/10 if symptomatic otherwise back to work without restrictions.   Encounter related to worker's compensation claim    The patient is advised to call or return to clinic if he does not see an improvement in symptoms, or to seek the care of the closest emergency department if he worsens with  the above plan.   Deliah BostonMichael Makari Portman, MHS, PA-C [facility] 02/23/2018 2:14 PM

## 2018-02-23 NOTE — Patient Instructions (Signed)
     IF you received an x-ray today, you will receive an invoice from [facility]. Please contact [facility] at [phone] with questions or concerns regarding your invoice.   IF you received labwork today, you will receive an invoice from [facility]. Please contact [facility] at [phone] with questions or concerns regarding your invoice.   Our billing staff will not be able to assist you with questions regarding bills from these companies.  You will be contacted with the lab results as soon as they are available. The fastest way to get your results is to activate your My Chart account. Instructions are located on the last page of this paperwork. If you have not heard from us regarding the results in 2 weeks, please contact this office.     

## 2018-02-27 ENCOUNTER — Ambulatory Visit (INDEPENDENT_AMBULATORY_CARE_PROVIDER_SITE_OTHER): Payer: Worker's Compensation | Admitting: Family Medicine

## 2018-02-27 ENCOUNTER — Other Ambulatory Visit: Payer: Self-pay

## 2018-02-27 ENCOUNTER — Encounter: Payer: Self-pay | Admitting: Family Medicine

## 2018-02-27 DIAGNOSIS — S39012D Strain of muscle, fascia and tendon of lower back, subsequent encounter: Secondary | ICD-10-CM

## 2018-02-27 DIAGNOSIS — M545 Low back pain, unspecified: Secondary | ICD-10-CM

## 2018-02-27 NOTE — Patient Instructions (Addendum)
To get your prescriptions filled and take them as soon as possible  Stay off work for another 2 days to rest the back, then be cautious with lifting and turning when you do return to work this Thursday, June 13.    IF you received an x-ray today, you will receive an invoice from [facility]. Please contact [facility] at [phone] with questions or concerns regarding your invoice.   IF you received labwork today, you will receive an invoice from [facility]. Please contact [facility] at [phone] with questions or concerns regarding your invoice.   Our billing staff will not be able to assist you with questions regarding bills from these companies.  You will be contacted with the lab results as soon as they are available. The fastest way to get your results is to activate your My Chart account. Instructions are located on the last page of this paperwork. If you have not heard from us regarding the results in 2 weeks, please contact this office.

## 2018-02-27 NOTE — Progress Notes (Signed)
Patient ID: Dennis BordersRicky Vega, male    DOB: 02/16/1961  Age: 57 y.o. MRN: [id number]  Chief Complaint  Patient presents with  . Follow-up    acute left sided low back pain without sciatica, per pt  "still have a glitch" seen on 02/23/18    Subjective:   Man who came in last week after injuring his back on Thursday.  He did not work Friday.  He had pain in the left low back laterally.  The pain has improved over the weekend down to a 3 pain today.  However when when he went into work today and lifted a metal door he got pain again that caught him in the same area.  They sent him back in for a recheck.  This is apparently found on [employer], but he did not have money to get his medications.  Therefore he is not taking them yet.  I explained to them that I thought that Worker's Comp. would pay for them.  Pain does not radiate.  At times he gets pain down his spine, but that is not related to this area of injury.  The spine itself is not bothering him today.  Current allergies, medications, problem list, past/family and social histories reviewed.  Objective:  There were no vitals taken for this visit.  No major acute distress.  He is a little overweight with abdominal weight.  His spine is nontender.  Flexion and extension are fairly normal.  Lateral tilt to the right causes pain on the left side.  Left lateral tilt cause a little bit of pain.  On rotation of the trunk there is also pain in that left side.  It is a little bit tender to touch above the posterior iliac crest area.  Straight leg raising test is negative.  Assessment & Plan:   Assessment: 1. Acute left-sided low back pain without sciatica   2. Low back strain, subsequent encounter       Plan: I think he needs to rest the back for couple more days since going into work today because of pain.  Urged to take his muscle relaxant and anti-inflammatory pain medication.  No orders of the defined types were placed in this  encounter.   No orders of the defined types were placed in this encounter.        Patient Instructions   To get your prescriptions filled and take them as soon as possible  Stay off work for another 2 days to rest the back, then be cautious with lifting and turning when you do return to work this Thursday, June 13.    IF you received an x-ray today, you will receive an invoice from [facility]. Please contact [facility] at [phone] with questions or concerns regarding your invoice.   IF you received labwork today, you will receive an invoice from [facility]. Please contact [facility] at [phone] with questions or concerns regarding your invoice.   Our billing staff will not be able to assist you with questions regarding bills from these companies.  You will be contacted with the lab results as soon as they are available. The fastest way to get your results is to activate your My Chart account. Instructions are located on the last page of this paperwork. If you have not heard from us regarding the results in 2 weeks, please contact this office.        Return if symptoms worsen or fail to improve.   Janace Hoardavid Sadao Weyer, MD 02/27/2018

## 2018-06-30 ENCOUNTER — Telehealth: Payer: Self-pay | Admitting: Physician Assistant

## 2018-06-30 NOTE — Telephone Encounter (Signed)
Copied from CRM [id number]. Topic: General - Other >> Jun 30, 2018  4:15 PM Marylen Ponto wrote: Reason for CRM: Kandee Keen with [employer] called in asking if a drug screen was done on pt in regards to the 02/23/18 Worker's Comp claim. [facility] requests a call back. Cb# [phone] Ext [phone]

## 2018-07-17 ENCOUNTER — Ambulatory Visit: Payer: Self-pay

## 2018-07-17 ENCOUNTER — Ambulatory Visit: Payer: Self-pay | Admitting: Family Medicine

## 2018-07-17 ENCOUNTER — Other Ambulatory Visit: Payer: Self-pay | Admitting: Occupational Medicine

## 2018-07-17 DIAGNOSIS — M79675 Pain in left toe(s): Secondary | ICD-10-CM

## 2019-06-23 ENCOUNTER — Emergency Department (HOSPITAL_COMMUNITY): Payer: Self-pay

## 2019-06-23 ENCOUNTER — Encounter (HOSPITAL_COMMUNITY): Payer: Self-pay | Admitting: Emergency Medicine

## 2019-06-23 ENCOUNTER — Other Ambulatory Visit: Payer: Self-pay

## 2019-06-23 ENCOUNTER — Ambulatory Visit (HOSPITAL_COMMUNITY)
Admission: EM | Admit: 2019-06-23 | Discharge: 2019-06-25 | Discharge status: Against Medical Advice | Payer: Self-pay | Attending: Internal Medicine | Admitting: Internal Medicine

## 2019-06-23 DIAGNOSIS — Z20828 Contact with and (suspected) exposure to other viral communicable diseases: Secondary | ICD-10-CM | POA: Diagnosis not present

## 2019-06-23 DIAGNOSIS — S02601A Fracture of unspecified part of body of right mandible, initial encounter for closed fracture: Secondary | ICD-10-CM | POA: Insufficient documentation

## 2019-06-23 DIAGNOSIS — S02622A Fracture of subcondylar process of left mandible, initial encounter for closed fracture: Secondary | ICD-10-CM | POA: Insufficient documentation

## 2019-06-23 DIAGNOSIS — S02642A Fracture of ramus of left mandible, initial encounter for closed fracture: Secondary | ICD-10-CM | POA: Diagnosis present

## 2019-06-23 DIAGNOSIS — R030 Elevated blood-pressure reading, without diagnosis of hypertension: Secondary | ICD-10-CM | POA: Diagnosis not present

## 2019-06-23 DIAGNOSIS — S02609A Fracture of mandible, unspecified, initial encounter for closed fracture: Secondary | ICD-10-CM

## 2019-06-23 HISTORY — DX: Essential (primary) hypertension: I10

## 2019-06-23 HISTORY — DX: Pneumonia, unspecified organism: J18.9

## 2019-06-23 LAB — CBC WITH DIFFERENTIAL/PLATELET
Abs Immature Granulocytes: 0.02 10*3/uL (ref 0.00–0.07)
Basophils Absolute: 0 10*3/uL (ref 0.0–0.1)
Basophils Relative: 0 %
Eosinophils Absolute: 0 10*3/uL (ref 0.0–0.5)
Eosinophils Relative: 0 %
HCT: 37.1 % — ABNORMAL LOW (ref 39.0–52.0)
Hemoglobin: 13.3 g/dL (ref 13.0–17.0)
Immature Granulocytes: 0 %
Lymphocytes Relative: 27 %
Lymphs Abs: 1.4 10*3/uL (ref 0.7–4.0)
MCH: 37.3 pg — ABNORMAL HIGH (ref 26.0–34.0)
MCHC: 35.8 g/dL (ref 30.0–36.0)
MCV: 103.9 fL — ABNORMAL HIGH (ref 80.0–100.0)
Monocytes Absolute: 1 10*3/uL (ref 0.1–1.0)
Monocytes Relative: 18 %
Neutro Abs: 2.8 10*3/uL (ref 1.7–7.7)
Neutrophils Relative %: 55 %
Platelets: 141 10*3/uL — ABNORMAL LOW (ref 150–400)
RBC: 3.57 MIL/uL — ABNORMAL LOW (ref 4.22–5.81)
RDW: 12.3 % (ref 11.5–15.5)
WBC: 5.3 10*3/uL (ref 4.0–10.5)
nRBC: 0 % (ref 0.0–0.2)

## 2019-06-23 LAB — BASIC METABOLIC PANEL
Anion gap: 16 — ABNORMAL HIGH (ref 5–15)
BUN: 6 mg/dL (ref 6–20)
CO2: 23 mmol/L (ref 22–32)
Calcium: 9.3 mg/dL (ref 8.9–10.3)
Chloride: 92 mmol/L — ABNORMAL LOW (ref 98–111)
Creatinine, Ser: 0.69 mg/dL (ref 0.61–1.24)
GFR calc Af Amer: >60 mL/min (ref >60)
GFR calc non Af Amer: >60 mL/min (ref >60)
Glucose, Bld: 98 mg/dL (ref 70–99)
Potassium: 3.3 mmol/L — ABNORMAL LOW (ref 3.5–5.1)
Sodium: 131 mmol/L — ABNORMAL LOW (ref 135–145)

## 2019-06-23 LAB — SARS CORONAVIRUS 2 BY RT PCR (HOSPITAL ORDER, PERFORMED IN ~~LOC~~ HOSPITAL LAB): SARS Coronavirus 2: NEGATIVE

## 2019-06-23 MED ORDER — MORPHINE SULFATE (PF) 2 MG/ML IV SOLN
2.0000 mg | INTRAVENOUS | Status: DC | PRN
  Administered 2019-06-23: 4 mg via INTRAVENOUS
  Administered 2019-06-24: 2 mg via INTRAVENOUS
  Administered 2019-06-25: 4 mg via INTRAVENOUS
  Filled 2019-06-23 (×2): qty 1
  Filled 2019-06-23: qty 2
  Filled 2019-06-23: qty 1

## 2019-06-23 MED ORDER — MIDAZOLAM HCL 2 MG/2ML IJ SOLN
INTRAMUSCULAR | Status: AC
  Filled 2019-06-23: qty 2

## 2019-06-23 MED ORDER — ONDANSETRON HCL 4 MG PO TABS: 4.0000 mg | ORAL_TABLET | Freq: Four times a day (QID) | ORAL | Status: DC | PRN

## 2019-06-23 MED ORDER — PROPOFOL 10 MG/ML IV BOLUS
INTRAVENOUS | Status: AC
  Filled 2019-06-23: qty 20

## 2019-06-23 MED ORDER — ONDANSETRON HCL 4 MG/2ML IJ SOLN: 4.0000 mg | Freq: Four times a day (QID) | INTRAMUSCULAR | Status: DC | PRN

## 2019-06-23 MED ORDER — TETANUS-DIPHTH-ACELL PERTUSSIS 5-2.5-18.5 LF-MCG/0.5 IM SUSP
0.5000 mL | Freq: Once | INTRAMUSCULAR | Status: AC
  Administered 2019-06-23: 0.5 mL via INTRAMUSCULAR
  Filled 2019-06-23: qty 0.5

## 2019-06-23 MED ORDER — FENTANYL CITRATE (PF) 250 MCG/5ML IJ SOLN
INTRAMUSCULAR | Status: AC
  Filled 2019-06-23: qty 5

## 2019-06-23 MED ORDER — ONDANSETRON HCL 4 MG/2ML IJ SOLN
4.0000 mg | Freq: Once | INTRAMUSCULAR | Status: DC
  Filled 2019-06-23: qty 2

## 2019-06-23 MED ORDER — SODIUM CHLORIDE 0.9 % IV SOLN
Freq: Once | INTRAVENOUS | Status: AC
  Administered 2019-06-23: 21:00:00 via INTRAVENOUS

## 2019-06-23 MED ORDER — ACETAMINOPHEN 650 MG RE SUPP: 650.0000 mg | Freq: Four times a day (QID) | RECTAL | Status: DC | PRN

## 2019-06-23 MED ORDER — MORPHINE SULFATE (PF) 4 MG/ML IV SOLN
4.0000 mg | Freq: Once | INTRAVENOUS | Status: AC
  Administered 2019-06-23: 4 mg via INTRAVENOUS
  Filled 2019-06-23: qty 1

## 2019-06-23 MED ORDER — ACETAMINOPHEN 325 MG PO TABS: 650.0000 mg | ORAL_TABLET | Freq: Four times a day (QID) | ORAL | Status: DC | PRN

## 2019-06-23 NOTE — ED Triage Notes (Signed)
Pt arrives from home today complaining of facial swelling in his jaw area. Pt states it has been going on for a few weeks but feels like it is getting worse.
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
## 2019-06-23 NOTE — ED Notes (Signed)
Surgeon at bedside.  

## 2019-06-23 NOTE — ED Notes (Signed)
This nurse ensured pt had no questions relating to surgery in the morning. This nurse then went over informed consent form with pt and had pt sign. INFORMED CONSENT FORM ON TRAYTABLE
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
## 2019-06-24 ENCOUNTER — Ambulatory Visit (HOSPITAL_COMMUNITY): Payer: Self-pay

## 2019-06-24 ENCOUNTER — Encounter (HOSPITAL_COMMUNITY)
Admission: EM | Discharge status: Against Medical Advice | Payer: Self-pay | Source: Home / Self Care | Attending: Emergency Medicine

## 2019-06-24 ENCOUNTER — Encounter (HOSPITAL_COMMUNITY): Payer: Self-pay

## 2019-06-24 ENCOUNTER — Emergency Department (HOSPITAL_COMMUNITY): Payer: Self-pay | Admitting: Anesthesiology

## 2019-06-24 DIAGNOSIS — S02609A Fracture of mandible, unspecified, initial encounter for closed fracture: Secondary | ICD-10-CM | POA: Diagnosis present

## 2019-06-24 HISTORY — PX: ORIF MANDIBULAR FRACTURE: SHX2127

## 2019-06-24 LAB — SURGICAL PCR SCREEN
MRSA, PCR: NEGATIVE
Staphylococcus aureus: NEGATIVE

## 2019-06-24 LAB — HIV ANTIBODY (ROUTINE TESTING W REFLEX): HIV Screen 4th Generation wRfx: NONREACTIVE

## 2019-06-24 SURGERY — OPEN REDUCTION INTERNAL FIXATION (ORIF) MANDIBULAR FRACTURE
Anesthesia: General | Laterality: Bilateral

## 2019-06-24 MED ORDER — CHLORHEXIDINE GLUCONATE CLOTH 2 % EX PADS
6.0000 | MEDICATED_PAD | Freq: Once | CUTANEOUS | Status: AC
  Administered 2019-06-24: 6 via TOPICAL

## 2019-06-24 MED ORDER — CHLORHEXIDINE GLUCONATE 0.12 % MT SOLN
5.0000 mL | Freq: Four times a day (QID) | OROMUCOSAL | Status: DC
  Administered 2019-06-24 (×3): 5 mL via OROMUCOSAL
  Filled 2019-06-24 (×4): qty 15

## 2019-06-24 MED ORDER — MIDAZOLAM HCL 5 MG/5ML IJ SOLN
INTRAMUSCULAR | Status: DC | PRN
  Administered 2019-06-24: 2 mg via INTRAVENOUS

## 2019-06-24 MED ORDER — LIDOCAINE 2% (20 MG/ML) 5 ML SYRINGE
INTRAMUSCULAR | Status: DC | PRN
  Administered 2019-06-24: 80 mg via INTRAVENOUS

## 2019-06-24 MED ORDER — ONDANSETRON HCL 4 MG/2ML IJ SOLN: 4.0000 mg | INTRAMUSCULAR | Status: DC | PRN

## 2019-06-24 MED ORDER — PROPOFOL 10 MG/ML IV BOLUS
INTRAVENOUS | Status: DC | PRN
  Administered 2019-06-24: 200 mg via INTRAVENOUS

## 2019-06-24 MED ORDER — FENTANYL CITRATE (PF) 100 MCG/2ML IJ SOLN
25.0000 ug | INTRAMUSCULAR | Status: DC | PRN
  Administered 2019-06-24: 50 ug via INTRAVENOUS

## 2019-06-24 MED ORDER — DEXTROSE-NACL 5-0.45 % IV SOLN
INTRAVENOUS | Status: DC
  Administered 2019-06-24 (×2): via INTRAVENOUS

## 2019-06-24 MED ORDER — BUPIVACAINE-EPINEPHRINE (PF) 0.5% -1:200000 IJ SOLN
INTRAMUSCULAR | Status: DC | PRN
  Administered 2019-06-24: 7 mL

## 2019-06-24 MED ORDER — ONDANSETRON HCL 4 MG PO TABS: 4.0000 mg | ORAL_TABLET | ORAL | Status: DC | PRN

## 2019-06-24 MED ORDER — LACTATED RINGERS IV SOLN
INTRAVENOUS | Status: DC
  Administered 2019-06-24 (×2): via INTRAVENOUS

## 2019-06-24 MED ORDER — SUCCINYLCHOLINE CHLORIDE 200 MG/10ML IV SOSY
PREFILLED_SYRINGE | INTRAVENOUS | Status: DC | PRN
  Administered 2019-06-24: 120 mg via INTRAVENOUS

## 2019-06-24 MED ORDER — FENTANYL CITRATE (PF) 100 MCG/2ML IJ SOLN
INTRAMUSCULAR | Status: AC
  Administered 2019-06-24: 100 ug
  Filled 2019-06-24: qty 2

## 2019-06-24 MED ORDER — SUCCINYLCHOLINE CHLORIDE 200 MG/10ML IV SOSY
PREFILLED_SYRINGE | INTRAVENOUS | Status: AC
  Filled 2019-06-24: qty 10

## 2019-06-24 MED ORDER — CEFAZOLIN SODIUM-DEXTROSE 2-4 GM/100ML-% IV SOLN
2.0000 g | INTRAVENOUS | Status: AC
  Administered 2019-06-24: 10:00:00 2 g via INTRAVENOUS
  Filled 2019-06-24: qty 100

## 2019-06-24 MED ORDER — PROMETHAZINE HCL 25 MG/ML IJ SOLN: 6.2500 mg | INTRAMUSCULAR | Status: DC | PRN

## 2019-06-24 MED ORDER — MUPIROCIN 2 % EX OINT
1.0000 "application " | TOPICAL_OINTMENT | Freq: Two times a day (BID) | CUTANEOUS | Status: DC
  Administered 2019-06-24: 1 via NASAL
  Filled 2019-06-24: qty 22

## 2019-06-24 MED ORDER — LIDOCAINE 2% (20 MG/ML) 5 ML SYRINGE
INTRAMUSCULAR | Status: AC
  Filled 2019-06-24: qty 5

## 2019-06-24 MED ORDER — LABETALOL HCL 5 MG/ML IV SOLN: 5.0000 mg | INTRAVENOUS | Status: DC | PRN

## 2019-06-24 MED ORDER — DEXAMETHASONE SODIUM PHOSPHATE 10 MG/ML IJ SOLN
INTRAMUSCULAR | Status: AC
  Filled 2019-06-24: qty 1

## 2019-06-24 MED ORDER — ONDANSETRON HCL 4 MG/2ML IJ SOLN
INTRAMUSCULAR | Status: AC
  Filled 2019-06-24: qty 2

## 2019-06-24 MED ORDER — MIDAZOLAM HCL 2 MG/2ML IJ SOLN
INTRAMUSCULAR | Status: AC
  Filled 2019-06-24: qty 2

## 2019-06-24 MED ORDER — FENTANYL CITRATE (PF) 250 MCG/5ML IJ SOLN
INTRAMUSCULAR | Status: AC
  Filled 2019-06-24: qty 5

## 2019-06-24 MED ORDER — HYDROCODONE-ACETAMINOPHEN 7.5-325 MG/15ML PO SOLN
10.0000 mL | ORAL | Status: DC | PRN
  Administered 2019-06-24 (×2): 15 mL via ORAL
  Filled 2019-06-24 (×2): qty 15

## 2019-06-24 MED ORDER — IBUPROFEN 100 MG/5ML PO SUSP
400.0000 mg | Freq: Four times a day (QID) | ORAL | Status: DC | PRN
  Filled 2019-06-24: qty 20

## 2019-06-24 MED ORDER — PHENYLEPHRINE 40 MCG/ML (10ML) SYRINGE FOR IV PUSH (FOR BLOOD PRESSURE SUPPORT)
PREFILLED_SYRINGE | INTRAVENOUS | Status: AC
  Filled 2019-06-24: qty 10

## 2019-06-24 MED ORDER — ROCURONIUM BROMIDE 50 MG/5ML IV SOSY
PREFILLED_SYRINGE | INTRAVENOUS | Status: DC | PRN
  Administered 2019-06-24: 50 mg via INTRAVENOUS

## 2019-06-24 MED ORDER — ONDANSETRON HCL 4 MG/2ML IJ SOLN
INTRAMUSCULAR | Status: DC | PRN
  Administered 2019-06-24: 4 mg via INTRAVENOUS

## 2019-06-24 MED ORDER — BUPIVACAINE-EPINEPHRINE (PF) 0.5% -1:200000 IJ SOLN
INTRAMUSCULAR | Status: AC
  Filled 2019-06-24: qty 30

## 2019-06-24 MED ORDER — DEXMEDETOMIDINE HCL 200 MCG/2ML IV SOLN
INTRAVENOUS | Status: DC | PRN
  Administered 2019-06-24 (×3): 8 ug via INTRAVENOUS
  Administered 2019-06-24: 12 ug via INTRAVENOUS
  Administered 2019-06-24: 8 ug via INTRAVENOUS
  Administered 2019-06-24: 16 ug via INTRAVENOUS
  Administered 2019-06-24: 8 ug via INTRAVENOUS
  Administered 2019-06-24: 12 ug via INTRAVENOUS

## 2019-06-24 MED ORDER — SUGAMMADEX SODIUM 200 MG/2ML IV SOLN
INTRAVENOUS | Status: DC | PRN
  Administered 2019-06-24: 310 mg via INTRAVENOUS

## 2019-06-24 MED ORDER — OXYMETAZOLINE HCL 0.05 % NA SOLN
2.0000 | NASAL | Status: AC | PRN
  Administered 2019-06-24 (×2): 2 via NASAL
  Filled 2019-06-24 (×2): qty 30

## 2019-06-24 MED ORDER — CHLORHEXIDINE GLUCONATE 0.12 % MT SOLN
5.0000 mL | Freq: Four times a day (QID) | OROMUCOSAL | Status: DC
  Administered 2019-06-24: 5 mL via OROMUCOSAL
  Filled 2019-06-24: qty 15

## 2019-06-24 MED ORDER — 0.9 % SODIUM CHLORIDE (POUR BTL) OPTIME
TOPICAL | Status: DC | PRN
  Administered 2019-06-24: 1000 mL

## 2019-06-24 MED ORDER — DEXAMETHASONE SODIUM PHOSPHATE 10 MG/ML IJ SOLN
INTRAMUSCULAR | Status: DC | PRN
  Administered 2019-06-24: 10 mg via INTRAVENOUS

## 2019-06-24 MED ORDER — ROCURONIUM BROMIDE 10 MG/ML (PF) SYRINGE
PREFILLED_SYRINGE | INTRAVENOUS | Status: AC
  Filled 2019-06-24: qty 10

## 2019-06-24 MED ORDER — PROPOFOL 10 MG/ML IV BOLUS
INTRAVENOUS | Status: AC
  Filled 2019-06-24: qty 20

## 2019-06-24 MED ORDER — FENTANYL CITRATE (PF) 100 MCG/2ML IJ SOLN
INTRAMUSCULAR | Status: DC | PRN
  Administered 2019-06-24: 150 ug via INTRAVENOUS
  Administered 2019-06-24 (×2): 50 ug via INTRAVENOUS
  Administered 2019-06-24: 100 ug via INTRAVENOUS

## 2019-06-24 SURGICAL SUPPLY — 44 items
ATTRACTOMAT 16X20 MAGNETIC DRP (DRAPES) ×3 IMPLANT
BLADE SURG 15 STRL LF DISP TIS (BLADE) IMPLANT
BLADE SURG 15 STRL SS (BLADE)
CANISTER SUCT 3000ML PPV (MISCELLANEOUS) ×3 IMPLANT
COVER SURGICAL LIGHT HANDLE (MISCELLANEOUS) ×3 IMPLANT
COVER WAND RF STERILE (DRAPES) IMPLANT
DRAPE HALF SHEET 40X57 (DRAPES) IMPLANT
ELECT COATED BLADE 2.86 ST (ELECTRODE) IMPLANT
ELECT NDL TIP 2.8 STRL (NEEDLE) IMPLANT
ELECT NEEDLE TIP 2.8 STRL (NEEDLE) IMPLANT
ELECT REM PT RETURN 9FT ADLT (ELECTROSURGICAL) ×3
ELECTRODE REM PT RTRN 9FT ADLT (ELECTROSURGICAL) ×1 IMPLANT
GLOVE ECLIPSE 8.0 STRL XLNG CF (GLOVE) ×6 IMPLANT
GOWN STRL REUS W/ TWL LRG LVL3 (GOWN DISPOSABLE) ×2 IMPLANT
GOWN STRL REUS W/ TWL XL LVL3 (GOWN DISPOSABLE) ×1 IMPLANT
GOWN STRL REUS W/TWL LRG LVL3 (GOWN DISPOSABLE) ×6
GOWN STRL REUS W/TWL XL LVL3 (GOWN DISPOSABLE) ×2
KIT BASIN OR (CUSTOM PROCEDURE TRAY) ×3 IMPLANT
KIT TURNOVER KIT B (KITS) ×3 IMPLANT
NDL HYPO 25GX1X1/2 BEV (NEEDLE) IMPLANT
NEEDLE HYPO 25GX1X1/2 BEV (NEEDLE) IMPLANT
NS IRRIG 1000ML POUR BTL (IV SOLUTION) ×3 IMPLANT
PAD ARMBOARD 7.5X6 YLW CONV (MISCELLANEOUS) ×6 IMPLANT
PENCIL BUTTON HOLSTER BLD 10FT (ELECTRODE) ×3 IMPLANT
POSITIONER HEAD DONUT 9IN (MISCELLANEOUS) IMPLANT
PROTECTOR CORNEAL (OPHTHALMIC RELATED) IMPLANT
SCISSORS WIRE ANG 4 3/4 DISP (INSTRUMENTS) IMPLANT
SCREW UPPER FACE 2.0X12MM (Screw) ×8 IMPLANT
STAPLER VISISTAT 35W (STAPLE) IMPLANT
SUT CHROMIC 3 0 PS 2 (SUTURE) IMPLANT
SUT CHROMIC 4 0 P 3 18 (SUTURE) IMPLANT
SUT CHROMIC 5 0 P 3 (SUTURE) IMPLANT
SUT ETHILON 5 0 P 3 18 (SUTURE)
SUT ETHILON 6 0 P 1 (SUTURE) IMPLANT
SUT NYLON ETHILON 5-0 P-3 1X18 (SUTURE) IMPLANT
SUT SILK 2 0 PERMA HAND 18 BK (SUTURE) IMPLANT
SUT STEEL 0 (SUTURE)
SUT STEEL 0 18XMFL TIE 17 (SUTURE) IMPLANT
SUT STEEL 1 (SUTURE) IMPLANT
SUT STEEL 2 (SUTURE) ×6 IMPLANT
SUT STEEL 4 (SUTURE) IMPLANT
TOWEL GREEN STERILE FF (TOWEL DISPOSABLE) ×3 IMPLANT
TRAY ENT MC OR (CUSTOM PROCEDURE TRAY) ×3 IMPLANT
WATER STERILE IRR 1000ML POUR (IV SOLUTION) ×3 IMPLANT
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
## 2019-06-24 NOTE — Transfer of Care (Signed)
Immediate Anesthesia Transfer of Care Note  Patient: Dennis Vega  Procedure(s) Performed: MMF MANDIBULAR FRACTURE (Bilateral )  Patient Location: PACU  Anesthesia Type:General  Level of Consciousness: drowsy and patient cooperative  Airway & Oxygen Therapy: Patient Spontanous Breathing and Patient connected to face mask oxygen  Post-op Assessment: Report given to RN and Post -op Vital signs reviewed and stable  Post vital signs: Reviewed and stable  Last Vitals:  Vitals Value Taken Time  BP 165/103 06/24/19 1126  Temp    Pulse 63 06/24/19 1130  Resp 15 06/24/19 1130  SpO2 99 % 06/24/19 1130  Vitals shown include unvalidated device data.  Last Pain:  Vitals:   06/24/19 0919  TempSrc:   PainSc: 10-Worst pain ever      Patients Stated Pain Goal: 4 (33/00/76 2263)  Complications: No apparent anesthesia complications
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
## 2019-06-24 NOTE — ED Notes (Signed)
Pt given urinal for urine collection 

## 2019-06-24 NOTE — Anesthesia Postprocedure Evaluation (Signed)
Anesthesia Post Note  Patient: Dennis Vega  Procedure(s) Performed: MMF MANDIBULAR FRACTURE (Bilateral )     Patient location during evaluation: PACU Anesthesia Type: General Level of consciousness: awake and alert Pain management: pain level controlled Vital Signs Assessment: post-procedure vital signs reviewed and stable Respiratory status: spontaneous breathing, nonlabored ventilation and respiratory function stable Cardiovascular status: blood pressure returned to baseline and stable Postop Assessment: no apparent nausea or vomiting Anesthetic complications: no    Last Vitals:  Vitals:   06/24/19 1304 06/24/19 1359  BP: (!) 133/105 (!) 176/95  Pulse: (!) 58 61  Resp: 18 14  Temp: 36.4 C (!) 36.4 C  SpO2: 96% 96%    Last Pain:  Vitals:   06/24/19 1359  TempSrc: Oral  PainSc:                  Audry Pili

## 2019-06-24 NOTE — Anesthesia Procedure Notes (Signed)
Procedure Name: Intubation Date/Time: 06/24/2019 10:02 AM Performed by: Lance Coon, CRNA Pre-anesthesia Checklist: Patient identified, Emergency Drugs available, Suction available, Patient being monitored and Timeout performed Patient Re-evaluated:Patient Re-evaluated prior to induction Oxygen Delivery Method: Circle system utilized Preoxygenation: Pre-oxygenation with 100% oxygen Induction Type: IV induction and Rapid sequence Laryngoscope Size: Miller and 1 Grade View: Grade I Nasal Tubes: Right, Nasal prep performed and Nasal Rae Number of attempts: 1 Airway Equipment and Method: Stylet Placement Confirmation: ETT inserted through vocal cords under direct vision,  positive ETCO2 and breath sounds checked- equal and bilateral Tube secured with: Tape Dental Injury: Teeth and Oropharynx as per pre-operative assessment

## 2019-06-24 NOTE — Discharge Instructions (Signed)
Mandibular Fracture  A mandibular fracture is a break in the jawbone. Follow these instructions at home:  If told, apply ice to the injured area: ? Put ice in a plastic bag. ? Place a towel between your skin and the bag. ? Leave the ice on for 20 minutes, 2-3 times per day.  Take over-the-counter and prescription medicines only as told by your doctor.  Follow your doctor's instructions about diet. You may need to eat only soft foods or liquid foods. Make sure to get enough protein and other nutrients in your diet.  Sleep on your back. This makes it so that you do not put pressure on your jaw.  Try not to exercise so hard that you get short of breath. Contact a doctor if:  You have a bad headache.  You cannot feel parts of your face.  You have very bad pain in your jaw that does not get better when you take medicine.  You feel like you are going to throw up (nauseous) and nothing helps.  You feel anxious and nothing helps.  Your swelling or redness gets worse. Get help right away if:  You have a fever.  You have trouble breathing.  You feel like your airway is tight.  You cannot swallow your spit (saliva).  You make a high-pitched whistling sound when you breathe (wheeze). This information is not intended to replace advice given to you by your health care provider. Make sure you discuss any questions you have with your health care provider. Document Released: 11/29/2011 Document Revised: 10/09/2015 Document Reviewed: 06/01/2015 Elsevier Patient Education  2020 [employer].

## 2019-06-24 NOTE — Interval H&P Note (Signed)
History and Physical Interval Note:  06/24/2019 9:27 AM  Dennis Vega  has presented today for surgery, with the diagnosis of MANDIBLE FRACTURES, BILATERAL.  The various methods of treatment have been discussed with the patient and family. After consideration of risks, benefits and other options for treatment, the patient has consented to  Procedure(s): MMF AND POSSIBLE OPEN REDUCTION INTERNAL FIXATION (ORIF) MANDIBULAR FRACTURE (Bilateral) as a surgical intervention.  The patient's history has been re-reviewed, patient re-examined, no change in status, stable for surgery.  I have re-reviewed the patient's chart and labs.  Questions were answered to the patient's satisfaction.     Ileene Hutchinson [facility]

## 2019-06-25 LAB — BASIC METABOLIC PANEL
Anion gap: 14 (ref 5–15)
BUN: 7 mg/dL (ref 6–20)
CO2: 25 mmol/L (ref 22–32)
Calcium: 10.1 mg/dL (ref 8.9–10.3)
Chloride: 94 mmol/L — ABNORMAL LOW (ref 98–111)
Creatinine, Ser: 0.76 mg/dL (ref 0.61–1.24)
GFR calc Af Amer: >60 mL/min (ref >60)
GFR calc non Af Amer: >60 mL/min (ref >60)
Glucose, Bld: 131 mg/dL — ABNORMAL HIGH (ref 70–99)
Potassium: 3.9 mmol/L (ref 3.5–5.1)
Sodium: 133 mmol/L — ABNORMAL LOW (ref 135–145)

## 2019-06-25 LAB — CBC
HCT: 37.7 % — ABNORMAL LOW (ref 39.0–52.0)
Hemoglobin: 13.1 g/dL (ref 13.0–17.0)
MCH: 36.1 pg — ABNORMAL HIGH (ref 26.0–34.0)
MCHC: 34.7 g/dL (ref 30.0–36.0)
MCV: 103.9 fL — ABNORMAL HIGH (ref 80.0–100.0)
Platelets: 185 10*3/uL (ref 150–400)
RBC: 3.63 MIL/uL — ABNORMAL LOW (ref 4.22–5.81)
RDW: 12 % (ref 11.5–15.5)
WBC: 6.5 10*3/uL (ref 4.0–10.5)
nRBC: 0 % (ref 0.0–0.2)

## 2019-06-25 MED ORDER — LORAZEPAM 1 MG PO TABS: 1.0000 mg | ORAL_TABLET | ORAL | Status: DC | PRN

## 2019-06-25 MED ORDER — FOLIC ACID 1 MG PO TABS: 1.0000 mg | ORAL_TABLET | Freq: Every day | ORAL | Status: DC

## 2019-06-25 MED ORDER — LORAZEPAM 1 MG PO TABS: 0.0000 mg | ORAL_TABLET | Freq: Two times a day (BID) | ORAL | Status: DC

## 2019-06-25 MED ORDER — VITAMIN B-1 100 MG PO TABS: 100.0000 mg | ORAL_TABLET | Freq: Every day | ORAL | Status: DC

## 2019-06-25 MED ORDER — LORAZEPAM 2 MG/ML IJ SOLN
1.0000 mg | INTRAMUSCULAR | Status: DC | PRN
  Filled 2019-06-25: qty 1

## 2019-06-25 MED ORDER — ADULT MULTIVITAMIN W/MINERALS CH: 1.0000 | ORAL_TABLET | Freq: Every day | ORAL | Status: DC

## 2019-06-25 MED ORDER — HYDROCODONE-ACETAMINOPHEN 7.5-325 MG/15ML PO SOLN: 10.0000 mL | Freq: Four times a day (QID) | ORAL | 0 refills | Status: AC | PRN

## 2019-06-25 MED ORDER — LORAZEPAM 1 MG PO TABS: 0.0000 mg | ORAL_TABLET | Freq: Four times a day (QID) | ORAL | Status: DC

## 2019-06-25 MED ORDER — THIAMINE HCL 100 MG/ML IJ SOLN
100.0000 mg | Freq: Every day | INTRAMUSCULAR | Status: DC
  Filled 2019-06-25: qty 2

## 2019-06-25 NOTE — Progress Notes (Addendum)
Pt was pacing in the room and to the hall. He drinks alchohol everyday. Last drink 2 days ago. Started on CIWA protocol. Seen by Dr Sloan Leiter this morning. I was able to talk to Dr Erik Obey. Agreed for discharge today. Pt refused all meds. He wants to go home. He pulled out his IV. 0935 I have his discharge papers but  Pt is not in his room anymore. He left without receiving his discharge instructions. I looked around the unit and down to the lobby but we cannot find him. Notified Dr Sloan Leiter. I called pt's sister, [name]. She lived with the pt. Discharge instructions was given to his sister April. They will try to come and pick up the wire cutter for emergency use.

## 2019-06-25 NOTE — Progress Notes (Signed)
Calm and quiet currently Awaiting ENT follow up-probable discharge later today
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
## 2019-06-26 ENCOUNTER — Encounter (HOSPITAL_COMMUNITY): Payer: Self-pay | Admitting: Otolaryngology

## 2019-07-23 ENCOUNTER — Emergency Department (HOSPITAL_COMMUNITY): Admission: EM | Admit: 2019-07-23 | Discharge: 2019-07-23 | Discharge status: Absent without leave | Payer: MEDICAID

## 2020-04-05 ENCOUNTER — Encounter (HOSPITAL_COMMUNITY): Payer: Self-pay | Admitting: *Deleted

## 2020-04-05 ENCOUNTER — Other Ambulatory Visit: Payer: Self-pay

## 2020-04-05 ENCOUNTER — Inpatient Hospital Stay (HOSPITAL_COMMUNITY)
Admission: EM | Admit: 2020-04-05 | Discharge: 2020-04-12 | DRG: 897 | Disposition: A | Discharge status: Home / Self Care | Payer: No Typology Code available for payment source | Attending: Internal Medicine | Admitting: Internal Medicine

## 2020-04-05 DIAGNOSIS — I10 Essential (primary) hypertension: Secondary | ICD-10-CM

## 2020-04-05 DIAGNOSIS — R400 Somnolence: Secondary | ICD-10-CM | POA: Diagnosis present

## 2020-04-05 DIAGNOSIS — F10231 Alcohol dependence with withdrawal delirium: Principal | ICD-10-CM | POA: Diagnosis present

## 2020-04-05 DIAGNOSIS — F419 Anxiety disorder, unspecified: Secondary | ICD-10-CM | POA: Diagnosis present

## 2020-04-05 DIAGNOSIS — F10239 Alcohol dependence with withdrawal, unspecified: Secondary | ICD-10-CM | POA: Diagnosis present

## 2020-04-05 DIAGNOSIS — Z590 Homelessness: Secondary | ICD-10-CM

## 2020-04-05 DIAGNOSIS — D61818 Other pancytopenia: Secondary | ICD-10-CM | POA: Diagnosis present

## 2020-04-05 DIAGNOSIS — F10930 Alcohol use, unspecified with withdrawal, uncomplicated: Secondary | ICD-10-CM

## 2020-04-05 DIAGNOSIS — R7401 Elevation of levels of liver transaminase levels: Secondary | ICD-10-CM

## 2020-04-05 DIAGNOSIS — R17 Unspecified jaundice: Secondary | ICD-10-CM

## 2020-04-05 DIAGNOSIS — D696 Thrombocytopenia, unspecified: Secondary | ICD-10-CM

## 2020-04-05 DIAGNOSIS — F10230 Alcohol dependence with withdrawal, uncomplicated: Principal | ICD-10-CM

## 2020-04-05 DIAGNOSIS — E876 Hypokalemia: Secondary | ICD-10-CM | POA: Diagnosis present

## 2020-04-05 DIAGNOSIS — F10939 Alcohol use, unspecified with withdrawal, unspecified: Secondary | ICD-10-CM

## 2020-04-05 DIAGNOSIS — Z20822 Contact with and (suspected) exposure to covid-19: Secondary | ICD-10-CM | POA: Diagnosis present

## 2020-04-05 DIAGNOSIS — D7589 Other specified diseases of blood and blood-forming organs: Secondary | ICD-10-CM | POA: Diagnosis present

## 2020-04-05 DIAGNOSIS — F1010 Alcohol abuse, uncomplicated: Secondary | ICD-10-CM

## 2020-04-05 DIAGNOSIS — K760 Fatty (change of) liver, not elsewhere classified: Secondary | ICD-10-CM | POA: Diagnosis present

## 2020-04-05 DIAGNOSIS — F10931 Alcohol use, unspecified with withdrawal delirium: Secondary | ICD-10-CM | POA: Diagnosis present

## 2020-04-05 LAB — CBC
HCT: 40.6 % (ref 39.0–52.0)
Hemoglobin: 13.7 g/dL (ref 13.0–17.0)
MCH: 35.9 pg — ABNORMAL HIGH (ref 26.0–34.0)
MCHC: 33.7 g/dL (ref 30.0–36.0)
MCV: 106.3 fL — ABNORMAL HIGH (ref 80.0–100.0)
Platelets: 136 10*3/uL — ABNORMAL LOW (ref 150–400)
RBC: 3.82 MIL/uL — ABNORMAL LOW (ref 4.22–5.81)
RDW: 12.2 % (ref 11.5–15.5)
WBC: 3.5 10*3/uL — ABNORMAL LOW (ref 4.0–10.5)
nRBC: 0 % (ref 0.0–0.2)

## 2020-04-05 LAB — COMPREHENSIVE METABOLIC PANEL
ALT: 42 U/L (ref 0–44)
AST: 72 U/L — ABNORMAL HIGH (ref 15–41)
Albumin: 4.1 g/dL (ref 3.5–5.0)
Alkaline Phosphatase: 60 U/L (ref 38–126)
Anion gap: 11 (ref 5–15)
BUN: 7 mg/dL (ref 6–20)
CO2: 25 mmol/L (ref 22–32)
Calcium: 9.1 mg/dL (ref 8.9–10.3)
Chloride: 100 mmol/L (ref 98–111)
Creatinine, Ser: 0.75 mg/dL (ref 0.61–1.24)
GFR calc Af Amer: >60 mL/min (ref >60)
GFR calc non Af Amer: >60 mL/min (ref >60)
Glucose, Bld: 114 mg/dL — ABNORMAL HIGH (ref 70–99)
Potassium: 3.9 mmol/L (ref 3.5–5.1)
Sodium: 136 mmol/L (ref 135–145)
Total Bilirubin: 1.5 mg/dL — ABNORMAL HIGH (ref 0.3–1.2)
Total Protein: 7.4 g/dL (ref 6.5–8.1)

## 2020-04-05 LAB — ETHANOL: Alcohol, Ethyl (B): 50 mg/dL — ABNORMAL HIGH (ref <10)

## 2020-04-05 MED ORDER — SODIUM CHLORIDE 0.9% FLUSH
3.0000 mL | Freq: Once | INTRAVENOUS | Status: AC
  Administered 2020-04-06: 3 mL via INTRAVENOUS

## 2020-04-05 NOTE — ED Triage Notes (Signed)
The pt is here for alcohol withdrawal his last alcohol was this am  He reports that he is jittery  Both arms shake intermittently  But no shaking when he is being asked questions

## 2020-04-06 DIAGNOSIS — F10231 Alcohol dependence with withdrawal delirium: Principal | ICD-10-CM

## 2020-04-06 DIAGNOSIS — F10939 Alcohol use, unspecified with withdrawal, unspecified: Secondary | ICD-10-CM | POA: Diagnosis present

## 2020-04-06 DIAGNOSIS — F10931 Alcohol use, unspecified with withdrawal delirium: Secondary | ICD-10-CM | POA: Diagnosis present

## 2020-04-06 LAB — URINALYSIS, ROUTINE W REFLEX MICROSCOPIC
Bacteria, UA: NONE SEEN
Bilirubin Urine: NEGATIVE
Glucose, UA: NEGATIVE mg/dL
Hgb urine dipstick: NEGATIVE
Ketones, ur: 5 mg/dL — AB
Leukocytes,Ua: NEGATIVE
Nitrite: NEGATIVE
Protein, ur: 30 mg/dL — AB
Specific Gravity, Urine: 1.024 (ref 1.005–1.030)
pH: 6 (ref 5.0–8.0)

## 2020-04-06 LAB — MAGNESIUM
Magnesium: 1.8 mg/dL (ref 1.7–2.4)
Magnesium: 1.8 mg/dL (ref 1.7–2.4)

## 2020-04-06 LAB — CBC
HCT: 42.6 % (ref 39.0–52.0)
Hemoglobin: 14.6 g/dL (ref 13.0–17.0)
MCH: 36.2 pg — ABNORMAL HIGH (ref 26.0–34.0)
MCHC: 34.3 g/dL (ref 30.0–36.0)
MCV: 105.7 fL — ABNORMAL HIGH (ref 80.0–100.0)
Platelets: 122 10*3/uL — ABNORMAL LOW (ref 150–400)
RBC: 4.03 MIL/uL — ABNORMAL LOW (ref 4.22–5.81)
RDW: 11.9 % (ref 11.5–15.5)
WBC: 3.4 10*3/uL — ABNORMAL LOW (ref 4.0–10.5)
nRBC: 0 % (ref 0.0–0.2)

## 2020-04-06 LAB — FOLATE: Folate: 22.6 ng/mL (ref >5.9)

## 2020-04-06 LAB — RAPID URINE DRUG SCREEN, HOSP PERFORMED
Amphetamines: NOT DETECTED
Barbiturates: NOT DETECTED
Benzodiazepines: POSITIVE — AB
Cocaine: NOT DETECTED
Opiates: NOT DETECTED
Tetrahydrocannabinol: NOT DETECTED

## 2020-04-06 LAB — PHOSPHORUS: Phosphorus: 1.8 mg/dL — ABNORMAL LOW (ref 2.5–4.6)

## 2020-04-06 LAB — VITAMIN B12: Vitamin B-12: 229 pg/mL (ref 180–914)

## 2020-04-06 LAB — SARS CORONAVIRUS 2 BY RT PCR (HOSPITAL ORDER, PERFORMED IN ~~LOC~~ HOSPITAL LAB): SARS Coronavirus 2: NEGATIVE

## 2020-04-06 MED ORDER — CHLORDIAZEPOXIDE HCL 25 MG PO CAPS
ORAL_CAPSULE | ORAL | 0 refills | Status: DC
Start: 2020-04-06 — End: 2022-01-23

## 2020-04-06 MED ORDER — CHLORHEXIDINE GLUCONATE CLOTH 2 % EX PADS
6.0000 | MEDICATED_PAD | Freq: Every day | CUTANEOUS | Status: DC
  Administered 2020-04-06 – 2020-04-10 (×4): 6 via TOPICAL

## 2020-04-06 MED ORDER — POTASSIUM & SODIUM PHOSPHATES 280-160-250 MG PO PACK
2.0000 | PACK | Freq: Three times a day (TID) | ORAL | Status: DC
  Filled 2020-04-06 (×4): qty 2

## 2020-04-06 MED ORDER — ACETAMINOPHEN 650 MG RE SUPP: 650.0000 mg | Freq: Four times a day (QID) | RECTAL | Status: DC | PRN

## 2020-04-06 MED ORDER — ACETAMINOPHEN 325 MG PO TABS: 650.0000 mg | ORAL_TABLET | Freq: Four times a day (QID) | ORAL | Status: DC | PRN

## 2020-04-06 MED ORDER — LORAZEPAM 1 MG PO TABS: 0.0000 mg | ORAL_TABLET | Freq: Four times a day (QID) | ORAL | Status: DC

## 2020-04-06 MED ORDER — ONDANSETRON HCL 4 MG PO TABS: 4.0000 mg | ORAL_TABLET | Freq: Four times a day (QID) | ORAL | Status: DC | PRN

## 2020-04-06 MED ORDER — LORAZEPAM 1 MG PO TABS: 1.0000 mg | ORAL_TABLET | ORAL | Status: DC | PRN

## 2020-04-06 MED ORDER — LORAZEPAM 2 MG/ML IJ SOLN
0.0000 mg | Freq: Four times a day (QID) | INTRAMUSCULAR | Status: DC
  Administered 2020-04-06 (×2): 2 mg via INTRAVENOUS
  Filled 2020-04-06: qty 1
  Filled 2020-04-06: qty 2
  Filled 2020-04-06: qty 1

## 2020-04-06 MED ORDER — LORAZEPAM 2 MG/ML IJ SOLN
1.0000 mg | INTRAMUSCULAR | Status: DC | PRN
  Administered 2020-04-06: 4 mg via INTRAVENOUS
  Administered 2020-04-06: 3 mg via INTRAVENOUS
  Administered 2020-04-06: 2 mg via INTRAVENOUS
  Administered 2020-04-06 (×2): 4 mg via INTRAVENOUS
  Filled 2020-04-06 (×3): qty 2
  Filled 2020-04-06: qty 1

## 2020-04-06 MED ORDER — LORAZEPAM 1 MG PO TABS: 0.0000 mg | ORAL_TABLET | Freq: Two times a day (BID) | ORAL | Status: DC

## 2020-04-06 MED ORDER — DEXMEDETOMIDINE HCL IN NACL 400 MCG/100ML IV SOLN
0.2000 ug/kg/h | INTRAVENOUS | Status: DC
  Administered 2020-04-06: 0.2 ug/kg/h via INTRAVENOUS
  Administered 2020-04-07: 0.3 ug/kg/h via INTRAVENOUS
  Filled 2020-04-06 (×2): qty 100

## 2020-04-06 MED ORDER — FENTANYL CITRATE (PF) 100 MCG/2ML IJ SOLN: 50.0000 ug | INTRAMUSCULAR | Status: DC | PRN

## 2020-04-06 MED ORDER — LORAZEPAM 2 MG/ML IJ SOLN
2.0000 mg | Freq: Once | INTRAMUSCULAR | Status: AC
  Administered 2020-04-06: 2 mg via INTRAVENOUS
  Filled 2020-04-06: qty 1

## 2020-04-06 MED ORDER — LORAZEPAM 2 MG/ML IJ SOLN
2.0000 mg | INTRAMUSCULAR | Status: DC | PRN
  Administered 2020-04-07 – 2020-04-08 (×3): 4 mg via INTRAVENOUS
  Administered 2020-04-09: 2 mg via INTRAVENOUS
  Filled 2020-04-06: qty 2
  Filled 2020-04-06: qty 1
  Filled 2020-04-06 (×2): qty 2

## 2020-04-06 MED ORDER — CHLORDIAZEPOXIDE HCL 25 MG PO CAPS
25.0000 mg | ORAL_CAPSULE | Freq: Four times a day (QID) | ORAL | Status: DC
  Administered 2020-04-06: 25 mg via ORAL
  Filled 2020-04-06: qty 1

## 2020-04-06 MED ORDER — LORAZEPAM 2 MG/ML IJ SOLN: 0.0000 mg | Freq: Two times a day (BID) | INTRAMUSCULAR | Status: DC

## 2020-04-06 MED ORDER — THIAMINE HCL 100 MG/ML IJ SOLN: 100.0000 mg | Freq: Every day | INTRAMUSCULAR | Status: DC

## 2020-04-06 MED ORDER — DEXTROSE-NACL 5-0.9 % IV SOLN: INTRAVENOUS | Status: DC

## 2020-04-06 MED ORDER — THIAMINE HCL 100 MG PO TABS: 100.0000 mg | ORAL_TABLET | Freq: Every day | ORAL | Status: DC

## 2020-04-06 MED ORDER — LORAZEPAM 2 MG/ML IJ SOLN: 1.0000 mg | INTRAMUSCULAR | Status: DC | PRN

## 2020-04-06 MED ORDER — ENOXAPARIN SODIUM 40 MG/0.4ML ~~LOC~~ SOLN
40.0000 mg | SUBCUTANEOUS | Status: DC
  Administered 2020-04-07 – 2020-04-12 (×6): 40 mg via SUBCUTANEOUS
  Filled 2020-04-06 (×6): qty 0.4

## 2020-04-06 MED ORDER — THIAMINE HCL 100 MG/ML IJ SOLN
Freq: Once | INTRAVENOUS | Status: AC
  Filled 2020-04-06: qty 1000

## 2020-04-06 MED ORDER — ONDANSETRON HCL 4 MG/2ML IJ SOLN: 4.0000 mg | Freq: Four times a day (QID) | INTRAMUSCULAR | Status: DC | PRN

## 2020-04-06 MED ORDER — SODIUM CHLORIDE 0.9 % IV BOLUS
1000.0000 mL | Freq: Once | INTRAVENOUS | Status: AC
  Administered 2020-04-06: 1000 mL via INTRAVENOUS

## 2020-04-06 NOTE — ED Notes (Signed)
Dr Gwyneth Revels to speak with CCM

## 2020-04-06 NOTE — ED Notes (Signed)
Admitting at bedside 

## 2020-04-06 NOTE — ED Notes (Signed)
Pt has tremors, unable to stand to get dressed without shaking- Dr. Hyacinth Meeker and Cammy Copa, PA made aware. Orders received.

## 2020-04-06 NOTE — Consult Note (Signed)
NAME:  Dennis Vega, MRN:  [id number], DOB:  04-Jul-1961, LOS: 0 ADMISSION DATE:  04/05/2020, CONSULTATION DATE:  04/06/2020 REFERRING MD:  Dr. Barbaraann Boys, IMTS, CHIEF COMPLAINT:  Delirium tremens   Brief History   59 yo male with history ETOH with tremor, agitation, confusion from ETOH withdrawal.  Transferred to ICU for precedex.  History of present illness   Hx from chart.  Drinks about a fifth of liquor per day.  He has chronic tremor.  This was getting much worse.  He was also getting more agitated and confused.  Seen by IMTS.  Started on CIWA.  Still had progression of agitation and confusion.  PCCM asked to assess for precedex.  Past Medical History  HTN, ETOH  Significant Hospital Events   7/18 Admit  Consults:    Procedures:    Significant Diagnostic Tests:    Micro Data:  COVID 7/18 >> negative  Antimicrobials:     Interim history/subjective:    Objective   Blood pressure (!) 160/84, pulse 79, temperature 98.2 F (36.8 C), temperature source Oral, resp. rate (!) 29, height 5\' 9"  (1.753 m), weight 83.9 kg, SpO2 96 %.        Intake/Output Summary (Last 24 hours) at 04/06/2020 1746 Last data filed at 04/06/2020 0316 Gross per 24 hour  Intake 1000 ml  Output --  Net 1000 ml   Filed Weights   04/05/20 1934  Weight: 83.9 kg    Examination:  General - agitated Eyes - pupils reactive ENT - poor dentition Cardiac - regular, tachycardic Chest - equal breath sounds b/l, no wheezing or rales Abdomen - soft, non tender, + bowel sounds Extremities - no cyanosis, clubbing, or edema Skin - no rashes Neuro - tremuous   Resolved Hospital Problem list     Assessment & Plan:   Delirium tremens. - admit to ICU - start precedex - prn ativan, fentanyl - prn labetalol with goal SBP < 160 - thiamine, folic acid, MVI - continue IV fluid with dextrose - f/u electrolytes - monitor for seizures - monitor need for intubation   Best practice:  Diet:  regular DVT prophylaxis: lovenox GI prophylaxis: not indicated Mobility: bed rest Code Status: full code Disposition: ICU  Labs   CBC: Recent Labs  Lab 04/05/20 1944 04/06/20 1120  WBC 3.5* 3.4*  HGB 13.7 14.6  HCT 40.6 42.6  MCV 106.3* 105.7*  PLT 136* 122*    Basic Metabolic Panel: Recent Labs  Lab 04/05/20 1944 04/06/20 0114 04/06/20 1120  NA 136  --   --   K 3.9  --   --   CL 100  --   --   CO2 25  --   --   GLUCOSE 114*  --   --   BUN 7  --   --   CREATININE 0.75  --   --   CALCIUM 9.1  --   --   MG  --  1.8 1.8  PHOS  --   --  1.8*   GFR: Estimated Creatinine Clearance: 100.6 mL/min (by C-G formula based on SCr of 0.75 mg/dL). Recent Labs  Lab 04/05/20 1944 04/06/20 1120  WBC 3.5* 3.4*    Liver Function Tests: Recent Labs  Lab 04/05/20 1944  AST 72*  ALT 42  ALKPHOS 60  BILITOT 1.5*  PROT 7.4  ALBUMIN 4.1   No results for input(s): LIPASE, AMYLASE in the last 168 hours. No results for input(s): AMMONIA in the last 168  hours.  ABG    Component Value Date/Time   TCO2 19 03/01/2011 0335     Coagulation Profile: No results for input(s): INR, PROTIME in the last 168 hours.  Cardiac Enzymes: No results for input(s): CKTOTAL, CKMB, CKMBINDEX, TROPONINI in the last 168 hours.  HbA1C: No results found for: HGBA1C  CBG: No results for input(s): GLUCAP in the last 168 hours.  Review of Systems:   Unable to obtain  Past Medical History  He,  has a past medical history of Hypertension and Pneumonia.   Surgical History    Past Surgical History:  Procedure Laterality Date  . HAND SURGERY     Right  . ORIF MANDIBULAR FRACTURE Bilateral 06/24/2019   Procedure: MMF MANDIBULAR FRACTURE;  Surgeon: Flo Shanks, MD;  Location: [facility] OR;  Service: ENT;  Laterality: Bilateral;     Social History   reports that he has never smoked. He has never used smokeless tobacco. He reports current alcohol use of about 10.0 standard drinks of alcohol per  week. He reports previous drug use. Drugs: "Crack" cocaine and Marijuana.   Family History   His family history includes Stroke in his mother.   Allergies No Known Allergies   Home Medications  Prior to Admission medications   Medication Sig Start Date End Date Taking? Authorizing Provider  ibuprofen (ADVIL) 200 MG tablet Take 200 mg by mouth daily as needed for moderate pain.   Yes [provider]  chlordiazePOXIDE (LIBRIUM) 25 MG capsule 50mg  PO TID x 1D, then 25-50mg  PO BID X 1D, then 25-50mg  PO QD X 1D 04/06/20   04/08/20, MD     Critical care time: 34 minutes  Dione Booze, MD [facility] Pulmonary/Critical Care Pager - [phone] 04/06/2020, 6:02 PM

## 2020-04-06 NOTE — ED Notes (Signed)
Patient continues to climb out of bed. Cannot redirect. Taking 4-5 staff members to get patient back into bed. Admitting paged again

## 2020-04-06 NOTE — ED Provider Notes (Signed)
[facility] EMERGENCY DEPARTMENT Provider Note   CSN: [id number] Arrival date & time: 04/05/20  1921   History Chief Complaint  Patient presents with  . alcohol withdrawal    Dennis Vega is a 59 y.o. male.  The history is provided by the patient.  He has history of hypertension and states that he is a heavy drinker and wants to stop drinking.  He drinks between 1 pint and 1/5 of hard liquor a day, last drink was this morning.  He is feeling shaky and jittery.  He denies hallucinations.  He has not gone through detox in the past.  He has not had problems when he stopped drinking in the past.  He denies other drug use.  He denies depression.  There is no homicidal or suicidal ideation.  Past Medical History:  Diagnosis Date  . Hypertension   . Pneumonia     Patient Active Problem List   Diagnosis Date Noted  . Mandible fracture (HCC) 06/24/2019  . Fracture of mandible (HCC) 06/23/2019    Past Surgical History:  Procedure Laterality Date  . HAND SURGERY     Right  . ORIF MANDIBULAR FRACTURE Bilateral 06/24/2019   Procedure: MMF MANDIBULAR FRACTURE;  Surgeon: Flo Shanks, MD;  Location: [facility] OR;  Service: ENT;  Laterality: Bilateral;       Family History  Problem Relation Age of Onset  . Stroke Mother     Social History   Tobacco Use  . Smoking status: Never Smoker  . Smokeless tobacco: Never Used  Vaping Use  . Vaping Use: Never used  Substance Use Topics  . Alcohol use: Yes    Alcohol/week: 10.0 standard drinks    Types: 8 Cans of beer, 2 Shots of liquor per week    Comment: Daily  . Drug use: Not Currently    Types: "Crack" cocaine, Marijuana    Comment: "a long time ago"    Home Medications Prior to Admission medications   Not on File    Allergies    Patient has no known allergies.  Review of Systems   Review of Systems  All other systems reviewed and are negative.   Physical Exam Updated Vital Signs BP (!) 167/106 (BP  Location: Right Arm)   Pulse 79   Temp 98.2 F (36.8 C) (Oral)   Resp (!) 26   Ht 5\' 9"  (1.753 m)   Wt 83.9 kg   SpO2 93%   BMI 27.32 kg/m   Physical Exam Vitals and nursing note reviewed.   59 year old male, resting comfortably and in no acute distress. Vital signs are significant for elevated blood pressure and elevated respiratory rate. Oxygen saturation is 93%, which is normal. Head is normocephalic and atraumatic. PERRLA, EOMI. Oropharynx is clear. Neck is nontender and supple without adenopathy or JVD. Back is nontender and there is no CVA tenderness. Lungs are clear without rales, wheezes, or rhonchi. Chest is nontender. Heart has regular rate and rhythm without murmur. Abdomen is soft, flat, nontender without masses or hepatosplenomegaly and peristalsis is normoactive. Extremities have no cyanosis or edema, full range of motion is present. Skin is warm and dry without rash. Neurologic: Awake, alert, oriented, cranial nerves are intact, there are no motor or sensory deficits.  He is generally tremulous.  ED Results / Procedures / Treatments   Labs (all labs ordered are listed, but only abnormal results are displayed) Labs Reviewed  COMPREHENSIVE METABOLIC PANEL - Abnormal; Notable for the following  components:      Result Value   Glucose, Bld 114 (*)    AST 72 (*)    Total Bilirubin 1.5 (*)    All other components within normal limits  CBC - Abnormal; Notable for the following components:   WBC 3.5 (*)    RBC 3.82 (*)    MCV 106.3 (*)    MCH 35.9 (*)    Platelets 136 (*)    All other components within normal limits  URINALYSIS, ROUTINE W REFLEX MICROSCOPIC - Abnormal; Notable for the following components:   Color, Urine AMBER (*)    APPearance HAZY (*)    Ketones, ur 5 (*)    Protein, ur 30 (*)    All other components within normal limits  ETHANOL - Abnormal; Notable for the following components:   Alcohol, Ethyl (B) 50 (*)    All other components within  normal limits   Procedures Procedures   Medications Ordered in ED Medications  sodium chloride flush (NS) 0.9 % injection 3 mL (has no administration in time range)    ED Course  I have reviewed the triage vital signs and the nursing notes.  Pertinent labs & imaging results that were available during my care of the patient were reviewed by me and considered in my medical decision making (see chart for details).  MDM Rules/Calculators/A&P Alcohol withdrawal complicated by hypertension.  Will start on CIWA protocol.  Labs show mild elevation of AST and bilirubin, mild thrombocytopenia, moderate macrocytosis.  All of these are expected in setting of alcohol abuse.  Old records are reviewed, and he has no relevant past visits.  4:09 AM After dose of lorazepam, he seems to be resting more comfortably but still is moderately tremulous.  Will observe in the ED.  7:35 AM Patient only minimally tremulous.  No hallucinations.  Blood pressure is elevated but not to a severe range.  He is felt to be safe for discharge.  He is discharged with prescription for chlordiazepoxide, given resources for outpatient substance abuse.  Final Clinical Impression(s) / ED Diagnoses Final diagnoses:  Alcohol withdrawal syndrome without complication (HCC)  Elevated blood pressure reading with diagnosis of hypertension  Total bilirubin, elevated  Elevated AST (SGOT)  Macrocytosis without anemia  Thrombocytopenia (HCC)    Rx / DC Orders ED Discharge Orders         Ordered    chlordiazePOXIDE (LIBRIUM) 25 MG capsule     Discontinue  Reprint     04/06/20 0734           Dione Booze, MD 04/06/20 [phone]

## 2020-04-06 NOTE — ED Provider Notes (Signed)
I was called to the bedside by the ER nurse, the patient has had worsening withdrawal tremor, he is unstable on his feet and cannot even pull his pants up due to severe tremor and imbalance. Reviewed labs and vital signs, the patient is not tachycardic or diaphoretic but has severe bilateral tremor. He is awake and alert, his speech is almost tremulous as well. His CIWA score is high, he will need more Ativan and frankly admission to the hospital due to alcohol withdrawal. He states he does not want to go home and drink thus I think it is much increased risk for complications from alcohol withdrawal. Unassigned medicine is paged for admission. This patient denies taking daily medications and has no family doctor  I discussed the patient with the IM teaching service residetn who will come to see for admission.  Final diagnoses:  Alcohol withdrawal syndrome without complication (HCC)  Elevated blood pressure reading with diagnosis of hypertension  Total bilirubin, elevated  Elevated AST (SGOT)  Macrocytosis without anemia  Thrombocytopenia (HCC)      Eber Hong, MD 04/06/20 [phone]

## 2020-04-06 NOTE — ED Notes (Signed)
Transferred pt from chair to bed - unsteady on feet needs at least 2 assiost

## 2020-04-06 NOTE — ED Notes (Signed)
Pt climbing out of the bed- states that he has to go to the bathroom-- assisted to bedside commode- unsteady on feet- incontinent of loose stool - changed sheets, gown, assisted back into bed, placed on bedpan

## 2020-04-06 NOTE — ED Notes (Signed)
Continues to attempt to get out of bed-- MD made aware

## 2020-04-06 NOTE — ED Notes (Signed)
CCM NP in to assess pt at this time

## 2020-04-06 NOTE — TOC Initial Note (Signed)
Transition of Care [facility]) - Initial/Assessment Note    Patient Details  Name: Dennis Vega MRN: [id number] Date of Birth: 02-19-1961  [facility] CM/SW Contact:    Lockie Pares, RN Phone Number: 04/06/2020, 4:57 PM  Clinical Narrative:                 Consulted to see patient at request of MD, patient agitated, thinks he is a bus depot. In wirist restraints, wants them off so he can use phone. Wife called, spoke to her regarding patient condition. She gives [place] permission to do what is necessary to get him well. She is aware he is currently in restraints for his safety, and he was given lots of medicine so far to decrease anxiety.  MD Consulted psych services for IVC if needed.as patient has tried to leave. However patient confused, and wife has given permission to treat accordingly. Patient got more aggressive as time went on  Tried music to calm him, restrained were loosened but then had to be reapplied,  MD called decision was made to start precedex and admit to ICU. Critical Care called By MD for admission.     Barriers to Discharge: Homeless with medical needs, Inadequate or no insurance   Patient Goals and CMS Choice Patient states their goals for this hospitalization and ongoing recovery are:: Pat[place] confused wife wants patient to get detoxed      Expected Discharge Plan and Services         Living arrangements for the past 2 months: No permanent address                                      Prior Living Arrangements/Services Living arrangements for the past 2 months: No permanent address Lives with:: Spouse Patient language and need for interpreter reviewed:: Yes        Need for Family Participation in Patient Care: Yes (Comment) Care giver support system in place?: Yes (comment)   Criminal Activity/Legal Involvement Pertinent to Current Situation/Hospitalization: No - Comment as needed  Activities of Daily Living      Permission  Sought/Granted Permission sought to share information with : Case Manager Permission granted to share information with : Yes, Verbal Permission Granted  Share Information with NAME: wife           Emotional Assessment Appearance:: Appears stated age Attitude/Demeanor/Rapport: Guarded, Combative Affect (typically observed): Agitated Orientation: : Oriented to Self Alcohol / Substance Use: Alcohol Use Psych Involvement: No (comment) (consulted)  Admission diagnosis:  Alcohol withdrawal (HCC) [F10.239] Patient Active Problem List   Diagnosis Date Noted  . Alcohol withdrawal (HCC) 04/06/2020  . Mandible fracture (HCC) 06/24/2019  . Fracture of mandible (HCC) 06/23/2019   PCP:  Patient, No Pcp Per Pharmacy:   [id number] [place], [place] - [address] AT [facility] [address] & [facility] [address] [place] [address] Phone: [phone] Fax: [phone]     Social Determinants of Health (SDOH) Interventions    Readmission Risk Interventions No flowsheet data found.

## 2020-04-06 NOTE — ED Notes (Signed)
Pt incontinent of urine but refuses to have bed changed.

## 2020-04-06 NOTE — Progress Notes (Signed)
Paged by nurse about elevated CIWAs and increased agitation. Went to evaluate at bedside.  He reports that he is very upset about being here, wanting to leave. When asked what could happen if he left he reported that he would go home. I explained that he is going through withdrawal, he has gotten multiple sedating medications, has low platelets, and was very unsteady on his feet which would put him at risk of falling and causing multiple issues. He was not able to tell me the risks of leaving. He does not seem to understand his medical condition and the risks involved. He was unsteady on his feet when evaluated earlier today. He was very agitated and irritable about having to stay.  He is tachypneic and hypertensive. On exam he is very irritable and tremulous, he is protecting his airway, lungs and cardiac exam unremarkable.   CIWAs have been elevated around 20, 19, 18, and 16. Very anxious and agitated, getting up out of bed, urinating on the floor, and being very unsteady on his feet. Requiring frequent doses of ativan. He has also received librium while here. Given the high CIWAs despite frequent doses of ativan and increasing agitation, anxiety, and irritability will consult PCCM to assess if precedex drip should be started. He is not actively trying to leave however does appear to be a danger to himself. CM contacted wife and updated on patients condition.   -Continue CIWA with ativan Secondary school teacher ordered -Consult PCCM -Consulted psychiatry

## 2020-04-06 NOTE — ED Notes (Signed)
Pt received breakfast tray 

## 2020-04-06 NOTE — Care Management (Signed)
Assigned primary care community health and wellness- call on Monday for appointment in patient instructions.

## 2020-04-06 NOTE — ED Notes (Signed)
Pt incontinent of urine in bed. Pt cleaned and sheets changed.
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
## 2020-04-07 LAB — COMPREHENSIVE METABOLIC PANEL
ALT: 35 U/L (ref 0–44)
AST: 38 U/L (ref 15–41)
Albumin: 3.4 g/dL — ABNORMAL LOW (ref 3.5–5.0)
Alkaline Phosphatase: 53 U/L (ref 38–126)
Anion gap: 10 (ref 5–15)
BUN: <5 mg/dL — ABNORMAL LOW (ref 6–20)
CO2: 23 mmol/L (ref 22–32)
Calcium: 9 mg/dL (ref 8.9–10.3)
Chloride: 104 mmol/L (ref 98–111)
Creatinine, Ser: 0.58 mg/dL — ABNORMAL LOW (ref 0.61–1.24)
GFR calc Af Amer: >60 mL/min (ref >60)
GFR calc non Af Amer: >60 mL/min (ref >60)
Glucose, Bld: 162 mg/dL — ABNORMAL HIGH (ref 70–99)
Potassium: 3.2 mmol/L — ABNORMAL LOW (ref 3.5–5.1)
Sodium: 137 mmol/L (ref 135–145)
Total Bilirubin: 2.9 mg/dL — ABNORMAL HIGH (ref 0.3–1.2)
Total Protein: 6.5 g/dL (ref 6.5–8.1)

## 2020-04-07 LAB — CBC
HCT: 43.4 % (ref 39.0–52.0)
Hemoglobin: 15 g/dL (ref 13.0–17.0)
MCH: 36.4 pg — ABNORMAL HIGH (ref 26.0–34.0)
MCHC: 34.6 g/dL (ref 30.0–36.0)
MCV: 105.3 fL — ABNORMAL HIGH (ref 80.0–100.0)
Platelets: 133 10*3/uL — ABNORMAL LOW (ref 150–400)
RBC: 4.12 MIL/uL — ABNORMAL LOW (ref 4.22–5.81)
RDW: 11.7 % (ref 11.5–15.5)
WBC: 3.2 10*3/uL — ABNORMAL LOW (ref 4.0–10.5)
nRBC: 0 % (ref 0.0–0.2)

## 2020-04-07 LAB — MAGNESIUM: Magnesium: 1.9 mg/dL (ref 1.7–2.4)

## 2020-04-07 LAB — PHOSPHORUS: Phosphorus: 3.2 mg/dL (ref 2.5–4.6)

## 2020-04-07 LAB — MRSA PCR SCREENING: MRSA by PCR: NEGATIVE

## 2020-04-07 MED ORDER — POTASSIUM CHLORIDE CRYS ER 20 MEQ PO TBCR
20.0000 meq | EXTENDED_RELEASE_TABLET | ORAL | Status: DC
  Filled 2020-04-07: qty 1

## 2020-04-07 MED ORDER — PHENOBARBITAL SODIUM 65 MG/ML IJ SOLN
65.0000 mg | Freq: Once | INTRAMUSCULAR | Status: AC
  Administered 2020-04-07: 65 mg via INTRAVENOUS
  Filled 2020-04-07: qty 1

## 2020-04-07 MED ORDER — POTASSIUM CHLORIDE 10 MEQ/100ML IV SOLN
10.0000 meq | INTRAVENOUS | Status: AC
  Administered 2020-04-07 (×4): 10 meq via INTRAVENOUS
  Filled 2020-04-07 (×2): qty 100

## 2020-04-07 MED ORDER — ADULT MULTIVITAMIN LIQUID CH
15.0000 mL | Freq: Every day | ORAL | Status: DC
  Administered 2020-04-08: 15 mL via ORAL
  Filled 2020-04-07 (×3): qty 15

## 2020-04-07 MED ORDER — THIAMINE HCL 100 MG/ML IJ SOLN
100.0000 mg | Freq: Every day | INTRAMUSCULAR | Status: DC
  Administered 2020-04-07 – 2020-04-08 (×2): 100 mg via INTRAVENOUS
  Filled 2020-04-07 (×2): qty 2

## 2020-04-07 MED ORDER — POTASSIUM CHLORIDE 10 MEQ/100ML IV SOLN
10.0000 meq | INTRAVENOUS | Status: AC
  Administered 2020-04-07 (×2): 10 meq via INTRAVENOUS
  Filled 2020-04-07: qty 100

## 2020-04-07 MED ORDER — DEXMEDETOMIDINE HCL IN NACL 400 MCG/100ML IV SOLN
0.2000 ug/kg/h | INTRAVENOUS | Status: DC
  Administered 2020-04-07 – 2020-04-08 (×2): 0.4 ug/kg/h via INTRAVENOUS
  Administered 2020-04-09: 0.2 ug/kg/h via INTRAVENOUS
  Filled 2020-04-07 (×3): qty 100

## 2020-04-07 MED ORDER — FOLIC ACID 5 MG/ML IJ SOLN
1.0000 mg | Freq: Every day | INTRAMUSCULAR | Status: DC
  Administered 2020-04-07 – 2020-04-08 (×2): 1 mg via INTRAVENOUS
  Filled 2020-04-07 (×3): qty 0.2

## 2020-04-07 NOTE — Consult Note (Signed)
NAME:  Dennis Vega, MRN:  [id number], DOB:  June 07, 1961, LOS: 1 ADMISSION DATE:  04/05/2020, CONSULTATION DATE:  04/06/2020 REFERRING MD:  Dr. Barbaraann Boys, IMTS, CHIEF COMPLAINT:  Delirium tremens   Brief History   59 yo male with history ETOH with tremor, agitation, confusion from ETOH withdrawal.  Transferred to ICU for precedex.  History of present illness   Hx from chart.  Drinks about a fifth of liquor per day.  He has chronic tremor.  This was getting much worse.  He was also getting more agitated and confused.  Seen by IMTS.  Started on CIWA.  Still had progression of agitation and confusion.  PCCM asked to assess for precedex.  Past Medical History  HTN, ETOH  Significant Hospital Events   7/18 Admit  Consults:    Procedures:    Significant Diagnostic Tests:    Micro Data:  COVID 7/18 >> negative  Antimicrobials:     Interim history/subjective:  7/19: On Precedex drip. Somnolent on approach. Very agitated and refused complete physical exam. On attending rounds more cooperative.    Objective   Blood pressure (!) 166/93, pulse (!) 111, temperature (!) 97 F (36.1 C), temperature source Oral, resp. rate 18, height 5\' 9"  (1.753 m), weight 83.9 kg, SpO2 95 %.        Intake/Output Summary (Last 24 hours) at 04/07/2020 0849 Last data filed at 04/07/2020 0600 Gross per 24 hour  Intake 1056.51 ml  Output 700 ml  Net 356.51 ml   Filed Weights   04/05/20 1934  Weight: 83.9 kg    Examination:  Gen: Agitated, angry, refused complete exam HEENT: [place]/AT , EOM grossly intact CV: RRR, no m, r,g  Neuro: Moving all extremities , no tremors   Noted labs: Mg 1.9 , Phos 3.2 , K 3.2, Cr nl, Total Billi 2.9 , AST and ALT nl limits  Resolved Hospital Problem list     Assessment & Plan:   Delirium tremens. Alcohol use disorder Patient   - Stop precedex, will give dose of phenobarbital today.  - prn ativan - prn labetalol with goal SBP < 160 - thiamine, folic acid,  MVI - stop IV fluid with dextrose - regular diet - daily BMP, watch for refeeding syndrome  - monitor for seizures - monitor need for intubation  Hypokalemia -K 3.2 ,  Replaced   Best practice:  Diet: regular DVT prophylaxis: lovenox GI prophylaxis: not indicated Mobility: bed rest Code Status: full code Disposition: ICU  Labs   CBC: Recent Labs  Lab 04/05/20 1944 04/06/20 1120 04/07/20 0231  WBC 3.5* 3.4* 3.2*  HGB 13.7 14.6 15.0  HCT 40.6 42.6 43.4  MCV 106.3* 105.7* 105.3*  PLT 136* 122* 133*    Basic Metabolic Panel: Recent Labs  Lab 04/05/20 1944 04/06/20 0114 04/06/20 1120 04/07/20 0231  NA 136  --   --  137  K 3.9  --   --  3.2*  CL 100  --   --  104  CO2 25  --   --  23  GLUCOSE 114*  --   --  162*  BUN 7  --   --  <5*  CREATININE 0.75  --   --  0.58*  CALCIUM 9.1  --   --  9.0  MG  --  1.8 1.8 1.9  PHOS  --   --  1.8* 3.2   GFR: Estimated Creatinine Clearance: 100.6 mL/min (A) (by C-G formula based on SCr of 0.58  mg/dL (L)). Recent Labs  Lab 04/05/20 1944 04/06/20 1120 04/07/20 0231  WBC 3.5* 3.4* 3.2*    Liver Function Tests: Recent Labs  Lab 04/05/20 1944 04/07/20 0231  AST 72* 38  ALT 42 35  ALKPHOS 60 53  BILITOT 1.5* 2.9*  PROT 7.4 6.5  ALBUMIN 4.1 3.4*   No results for input(s): LIPASE, AMYLASE in the last 168 hours. No results for input(s): AMMONIA in the last 168 hours.  ABG    Component Value Date/Time   TCO2 19 03/01/2011 0335     Coagulation Profile: No results for input(s): INR, PROTIME in the last 168 hours.  Cardiac Enzymes: No results for input(s): CKTOTAL, CKMB, CKMBINDEX, TROPONINI in the last 168 hours.  HbA1C: No results found for: HGBA1C  CBG: No results for input(s): GLUCAP in the last 168 hours.  Review of Systems:   Unable to obtain  Past Medical History  He,  has a past medical history of Hypertension and Pneumonia.   Surgical History    Past Surgical History:  Procedure Laterality  Date  . HAND SURGERY     Right  . ORIF MANDIBULAR FRACTURE Bilateral 06/24/2019   Procedure: MMF MANDIBULAR FRACTURE;  Surgeon: Flo Shanks, MD;  Location: [facility] OR;  Service: ENT;  Laterality: Bilateral;     Social History   reports that he has never smoked. He has never used smokeless tobacco. He reports current alcohol use of about 10.0 standard drinks of alcohol per week. He reports previous drug use. Drugs: "Crack" cocaine and Marijuana.   Family History   His family history includes Stroke in his mother.   Allergies No Known Allergies   Home Medications  Prior to Admission medications   Medication Sig Start Date End Date Taking? Authorizing Provider  ibuprofen (ADVIL) 200 MG tablet Take 200 mg by mouth daily as needed for moderate pain.   Yes [provider]  chlordiazePOXIDE (LIBRIUM) 25 MG capsule 50mg  PO TID x 1D, then 25-50mg  PO BID X 1D, then 25-50mg  PO QD X 1D 04/06/20   04/08/20, MD     Critical care time: 34 minutes  Dione Booze, MD [facility] Pulmonary/Critical Care Pager - [phone] 04/07/2020, 8:49 AM

## 2020-04-07 NOTE — Plan of Care (Signed)

## 2020-04-08 ENCOUNTER — Encounter (HOSPITAL_COMMUNITY): Payer: Self-pay | Admitting: Pulmonary Disease

## 2020-04-08 LAB — BASIC METABOLIC PANEL
Anion gap: 10 (ref 5–15)
BUN: 5 mg/dL — ABNORMAL LOW (ref 6–20)
CO2: 23 mmol/L (ref 22–32)
Calcium: 9.2 mg/dL (ref 8.9–10.3)
Chloride: 104 mmol/L (ref 98–111)
Creatinine, Ser: 0.72 mg/dL (ref 0.61–1.24)
GFR calc Af Amer: >60 mL/min (ref >60)
GFR calc non Af Amer: >60 mL/min (ref >60)
Glucose, Bld: 115 mg/dL — ABNORMAL HIGH (ref 70–99)
Potassium: 3.7 mmol/L (ref 3.5–5.1)
Sodium: 137 mmol/L (ref 135–145)

## 2020-04-08 LAB — MAGNESIUM: Magnesium: 2 mg/dL (ref 1.7–2.4)

## 2020-04-08 LAB — CBC
HCT: 47.4 % (ref 39.0–52.0)
Hemoglobin: 16.5 g/dL (ref 13.0–17.0)
MCH: 36.6 pg — ABNORMAL HIGH (ref 26.0–34.0)
MCHC: 34.8 g/dL (ref 30.0–36.0)
MCV: 105.1 fL — ABNORMAL HIGH (ref 80.0–100.0)
Platelets: 140 10*3/uL — ABNORMAL LOW (ref 150–400)
RBC: 4.51 MIL/uL (ref 4.22–5.81)
RDW: 11.4 % — ABNORMAL LOW (ref 11.5–15.5)
WBC: 3.9 10*3/uL — ABNORMAL LOW (ref 4.0–10.5)
nRBC: 0 % (ref 0.0–0.2)

## 2020-04-08 LAB — PHOSPHORUS: Phosphorus: 3.4 mg/dL (ref 2.5–4.6)

## 2020-04-08 MED ORDER — POLYETHYLENE GLYCOL 3350 17 G PO PACK: 17.0000 g | PACK | Freq: Two times a day (BID) | ORAL | Status: DC | PRN

## 2020-04-08 MED ORDER — POTASSIUM CHLORIDE CRYS ER 20 MEQ PO TBCR
30.0000 meq | EXTENDED_RELEASE_TABLET | Freq: Once | ORAL | Status: AC
  Administered 2020-04-08: 30 meq via ORAL
  Filled 2020-04-08: qty 1

## 2020-04-08 MED ORDER — PHENOBARBITAL SODIUM 130 MG/ML IJ SOLN
130.0000 mg | Freq: Once | INTRAMUSCULAR | Status: AC
  Administered 2020-04-08: 130 mg via INTRAVENOUS
  Filled 2020-04-08: qty 1

## 2020-04-08 NOTE — Progress Notes (Signed)
Upon entering room HR up in 160s, pt hallucinating, severe tremors, CIWA total 22 4mg  of of Ativan given. MD was made aware. Plan to give dose of phenobarb and cancel transfer order. Will continue to monitor closely.

## 2020-04-08 NOTE — Consult Note (Addendum)
   NAME:  Dennis Vega, MRN:  [id number], DOB:  [birth year], LOS: 2 ADMISSION DATE:  04/05/2020, CONSULTATION DATE:  04/06/2020 REFERRING MD:  Dr. Barbaraann Boys, IMTS, CHIEF COMPLAINT:  Delirium tremens   Brief History   59 yo male with history ETOH with tremor, agitation, confusion from ETOH withdrawal.  Transferred to ICU for precedex.  History of present illness   Hx from chart.  Drinks about a fifth of liquor per day.  He has chronic tremor.  This was getting much worse.  He was also getting more agitated and confused.  Seen by IMTS.  Started on CIWA.  Still had progression of agitation and confusion.  PCCM asked to assess for precedex.  Past Medical History  HTN, ETOH  Significant Hospital Events   7/18 Admit  Consults:    Procedures:    Significant Diagnostic Tests:    Micro Data:  COVID 7/18 >> negative  Antimicrobials:     Interim history/subjective:  7/20: Precedex stopped this am. Patient is cooperative on exam and emotional when discussing recently losing his job.   7/19: On Precedex drip. Somnolent on approach. Very agitated and refused complete physical exam. On attending rounds more cooperative.    Objective   Blood pressure (!) 157/100, pulse 64, temperature 97.8 F (36.6 C), temperature source Axillary, resp. rate (!) 23, height 5\' 9"  (1.753 m), weight 82.1 kg, SpO2 96 %.        Intake/Output Summary (Last 24 hours) at 04/08/2020 0701 Last data filed at 04/08/2020 0600 Gross per 24 hour  Intake 1092.02 ml  Output 1800 ml  Net -707.98 ml   Filed Weights   04/05/20 1934 04/08/20 0600  Weight: 83.9 kg 82.1 kg    Examination: General: NAD, cooperative  HE: Normocephalic, atraumatic , EOMI, Conjunctivae normal ENT: No congestion, no rhinorrhea, no exudate or erythema  Cardiovascular: Normal rate, regular rhythm.  No murmurs, rubs, or gallops Pulmonary : Effort normal, breath sounds normal. No wheezes, rales, or rhonchi Abdominal: soft, nontender,  bowel  sounds present Musculoskeletal: no swelling , deformity, injury ,or tenderness in extremities, Skin: Warm, dry , no bruising, erythema, or rash Psychiatric/Behavioral:  normal mood, normal behavior  Neuro: AOx4 , no tremor  Noted labs: Cr .72, K 3.7 Mg 2 Phos 3.4 Resolved Hospital Problem list     Assessment & Plan:   Delirium tremens. Alcohol use disorder Last drink on 7/17 around 7am. Patient given phenobarbital yesterday and attempted to wean off precedex. Patient continued to need low dose. Given one dose of ativan yesterday, CIWA of 23. Temor, agitation anxiety and visual disturbances scored at this time. The last 3 CIWA scores 3/23/3. - Continue CIWA w/ ativan - Stop precedex - thiamine, folic acid, MV - stop IV fluid with dextrose - regular diet - daily BMP, watch for refeeding syndrome   HTN - Care everywhere shows patient was prescribed amlodipine and lisinopril at some point. Patient has not been on medication at home. Will continue to monitor and patient will need to establish with PCP for further outpatient management.    Best practice:  Diet: regular DVT prophylaxis: lovenox GI prophylaxis: not indicated Mobility: up with assistance  Code Status: full code Disposition: ICU  10-04-1984, MD PGY2

## 2020-04-08 NOTE — Progress Notes (Signed)
Patient signed out to Dr.Seawell with IMTS. IMTS will take over as primary at 7am on 7/21.

## 2020-04-09 LAB — COMPREHENSIVE METABOLIC PANEL
ALT: 37 U/L (ref 0–44)
AST: 37 U/L (ref 15–41)
Albumin: 3.1 g/dL — ABNORMAL LOW (ref 3.5–5.0)
Alkaline Phosphatase: 49 U/L (ref 38–126)
Anion gap: 7 (ref 5–15)
BUN: 10 mg/dL (ref 6–20)
CO2: 24 mmol/L (ref 22–32)
Calcium: 9.2 mg/dL (ref 8.9–10.3)
Chloride: 105 mmol/L (ref 98–111)
Creatinine, Ser: 0.94 mg/dL (ref 0.61–1.24)
GFR calc Af Amer: >60 mL/min (ref >60)
GFR calc non Af Amer: >60 mL/min (ref >60)
Glucose, Bld: 117 mg/dL — ABNORMAL HIGH (ref 70–99)
Potassium: 3.5 mmol/L (ref 3.5–5.1)
Sodium: 136 mmol/L (ref 135–145)
Total Bilirubin: 1.9 mg/dL — ABNORMAL HIGH (ref 0.3–1.2)
Total Protein: 5.8 g/dL — ABNORMAL LOW (ref 6.5–8.1)

## 2020-04-09 LAB — PHOSPHORUS: Phosphorus: 3.8 mg/dL (ref 2.5–4.6)

## 2020-04-09 LAB — MAGNESIUM: Magnesium: 1.8 mg/dL (ref 1.7–2.4)

## 2020-04-09 MED ORDER — FOLIC ACID 1 MG PO TABS
1.0000 mg | ORAL_TABLET | Freq: Every day | ORAL | Status: DC
  Administered 2020-04-09 – 2020-04-12 (×4): 1 mg via ORAL
  Filled 2020-04-09 (×4): qty 1

## 2020-04-09 MED ORDER — LORAZEPAM 2 MG/ML IJ SOLN
1.0000 mg | INTRAMUSCULAR | Status: DC | PRN
  Administered 2020-04-09: 2 mg via INTRAVENOUS
  Filled 2020-04-09: qty 1

## 2020-04-09 MED ORDER — MAGNESIUM SULFATE IN D5W 1-5 GM/100ML-% IV SOLN
1.0000 g | Freq: Once | INTRAVENOUS | Status: AC
  Administered 2020-04-09: 1 g via INTRAVENOUS
  Filled 2020-04-09: qty 100

## 2020-04-09 MED ORDER — LORAZEPAM 1 MG PO TABS
1.0000 mg | ORAL_TABLET | ORAL | Status: DC | PRN
  Administered 2020-04-11: 2 mg via ORAL
  Filled 2020-04-09: qty 2

## 2020-04-09 MED ORDER — THIAMINE HCL 100 MG PO TABS
100.0000 mg | ORAL_TABLET | Freq: Every day | ORAL | Status: DC
  Administered 2020-04-09 – 2020-04-12 (×4): 100 mg via ORAL
  Filled 2020-04-09 (×4): qty 1

## 2020-04-09 MED ORDER — ADULT MULTIVITAMIN W/MINERALS CH
1.0000 | ORAL_TABLET | Freq: Every day | ORAL | Status: DC
  Administered 2020-04-09 – 2020-04-12 (×4): 1 via ORAL
  Filled 2020-04-09 (×4): qty 1

## 2020-04-09 MED ORDER — POTASSIUM CHLORIDE CRYS ER 20 MEQ PO TBCR
40.0000 meq | EXTENDED_RELEASE_TABLET | Freq: Once | ORAL | Status: AC
  Administered 2020-04-09: 40 meq via ORAL
  Filled 2020-04-09: qty 2

## 2020-04-09 NOTE — Progress Notes (Signed)
CSW spoke with patient at bedside. CSW offered patient Outpatient [facility]stance [facility] Resources. Patient Accepted. Patient requested resources for Housing and area shelters. Patient says he is homeless at the moment.CSW offered patient [facility] Resources,[employer] office of [employer], and [employer] for patient. Patient accepted.  CSW will continue to follow for any other patient needs.

## 2020-04-09 NOTE — Consult Note (Signed)
   NAME:  Dennis Vega, MRN:  [id number], DOB:  1961/07/27, LOS: 3 ADMISSION DATE:  04/05/2020, CONSULTATION DATE:  04/06/2020 REFERRING MD:  Dr. Barbaraann Boys, IMTS, CHIEF COMPLAINT:  Delirium tremens   Brief History   59 yo male with history ETOH with tremor, agitation, confusion from ETOH withdrawal.  Transferred to ICU for precedex.  History of present illness   Hx from chart.  Drinks about a fifth of liquor per day.  He has chronic tremor.  This was getting much worse.  He was also getting more agitated and confused.  Seen by IMTS.  Started on CIWA.  Still had progression of agitation and confusion.  PCCM asked to assess for precedex.  Past Medical History  HTN, ETOH  Significant Hospital Events   7/18 Admit  Consults:    Procedures:    Significant Diagnostic Tests:    Micro Data:  COVID 7/18 >> negative  Antimicrobials:     Interim history/subjective:  7/20: Precedex stopped this am. Patient is cooperative on exam and emotional when discussing recently losing his job.   7/19: On Precedex drip. Somnolent on approach. Very agitated and refused complete physical exam. On attending rounds more cooperative.    Objective   Blood pressure 140/84, pulse 79, temperature 98.1 F (36.7 C), temperature source Oral, resp. rate 19, height 5\' 9"  (1.753 m), weight 82.3 kg, SpO2 98 %.        Intake/Output Summary (Last 24 hours) at 04/09/2020 0704 Last data filed at 04/09/2020 0700 Gross per 24 hour  Intake 63.77 ml  Output 1000 ml  Net -936.23 ml   Filed Weights   04/05/20 1934 04/08/20 0600 04/09/20 0435  Weight: 83.9 kg 82.1 kg 82.3 kg    Examination: General: NAD, cooperative  HE: Normocephalic, atraumatic , EOMI, Conjunctivae normal ENT: No congestion, no rhinorrhea, no exudate or erythema  Cardiovascular: Normal rate, regular rhythm.  No murmurs, rubs, or gallops Pulmonary : Effort normal, breath sounds normal. No wheezes, rales, or rhonchi Abdominal: soft, nontender,   bowel sounds present Musculoskeletal: no swelling , deformity, injury ,or tenderness in extremities, Skin: Warm, dry , no bruising, erythema, or rash Psychiatric/Behavioral:  normal mood, normal behavior  Neuro: AOx4 , tremor in right hand  Noted labs: Cr .94- nl K 3.7 Mg 1.8 replete by Elink MD Phos 3.8  Total Bili 1.9 - down trending Resolved Hospital Problem list     Assessment & Plan:   Delirium tremens. Alcohol use disorder Hypokalemia, Hypomagnesia Last drink on 7/17 around 7am. Patient still in active withdrawal yesterday, he was given Ativan 4mg  x 2 yesterday per CIWA protocol. After patient scored CIWA of 22 order given for phenobarbital 130 mg. The last 3 CIWA scores 8 , 6 and 7.  - Mg and Potassium replaced Plan:  - Continue CIWA w/ ativan - thiamine, folic acid, MV - regular diet - daily BMP, watch for refeeding syndrome  - Should transfer today pending continued improvement  HTN - Care everywhere shows patient was prescribed amlodipine and lisinopril at some point. Patient has not been on medication at home. Will continue to monitor and patient will need to establish with PCP for further outpatient management.  - BP is improving with treatment of DT's   Best practice:  Diet: regular DVT prophylaxis: lovenox GI prophylaxis: not indicated Mobility: up with assistance  Code Status: full code Disposition: ICU  8/17, MD PGY2

## 2020-04-09 NOTE — Progress Notes (Signed)
IMTS to take over at 7am on 7/22 as primary. Transfer order placed.

## 2020-04-09 NOTE — Progress Notes (Signed)
eLink Physician-Brief Progress Note Patient Name: Purvis Sidle DOB: 30-Aug-1961 MRN: [id number]   Date of Service  04/09/2020  HPI/Events of Note  Hypokalemia and Hypomagnesemia - K+ = 3.5, Mg++ = 1.8 and Creatinine = 0.94.   eICU Interventions  Will replace Mg++ and K+.     Intervention Category Major Interventions: Electrolyte abnormality - evaluation and management  Danzig Macgregor Eugene 04/09/2020, 7:00 AM

## 2020-04-10 DIAGNOSIS — F1010 Alcohol abuse, uncomplicated: Secondary | ICD-10-CM

## 2020-04-10 DIAGNOSIS — R7401 Elevation of levels of liver transaminase levels: Secondary | ICD-10-CM

## 2020-04-10 LAB — COMPREHENSIVE METABOLIC PANEL
ALT: 36 U/L (ref 0–44)
AST: 32 U/L (ref 15–41)
Albumin: 3.4 g/dL — ABNORMAL LOW (ref 3.5–5.0)
Alkaline Phosphatase: 48 U/L (ref 38–126)
Anion gap: 9 (ref 5–15)
BUN: 7 mg/dL (ref 6–20)
CO2: 23 mmol/L (ref 22–32)
Calcium: 9.3 mg/dL (ref 8.9–10.3)
Chloride: 103 mmol/L (ref 98–111)
Creatinine, Ser: 0.87 mg/dL (ref 0.61–1.24)
GFR calc Af Amer: >60 mL/min (ref >60)
GFR calc non Af Amer: >60 mL/min (ref >60)
Glucose, Bld: 119 mg/dL — ABNORMAL HIGH (ref 70–99)
Potassium: 4 mmol/L (ref 3.5–5.1)
Sodium: 135 mmol/L (ref 135–145)
Total Bilirubin: 1.9 mg/dL — ABNORMAL HIGH (ref 0.3–1.2)
Total Protein: 6.4 g/dL — ABNORMAL LOW (ref 6.5–8.1)

## 2020-04-10 LAB — MAGNESIUM: Magnesium: 2 mg/dL (ref 1.7–2.4)

## 2020-04-10 LAB — PHOSPHORUS: Phosphorus: 3.2 mg/dL (ref 2.5–4.6)

## 2020-04-10 NOTE — Progress Notes (Signed)
Patient admitted for alcohol withdrawal, had been  Refusing care all night. CIWA = 9 and pt refused medication. Patient also refused to go for his [place] abd Patient verbalized he is not pregnant only pregnant people do ultrasound.  After persuasion, he agreed to do the [place] abd in the morning.  Dr. Huel Cote notified.

## 2020-04-10 NOTE — Evaluation (Addendum)
Physical Therapy Evaluation/ Discharge Patient Details Name: Dennis Vega MRN: [id number] DOB: June 08, 1961 Today's Date: 04/10/2020   History of Present Illness  59 yo admitted with active alchohol withdrawal who required ICU admission for Precedex and phenobarbitol. PMhx: ETOH use, HTN  Clinical Impression  Pt eager to get up and walk. PT able to walk 800' without assist with noted deficits in depth perception and pt reports he is aware he has impaired vision but has been unable to afford to be examined for glasses. Pt reports at least 5 falls with drinking and with tripping on stairs. Pt with impaired balance and cognition who would benefit from cane use for additional stability with mobility as well as OPPT to maximize balance. Pt aware of all above and encouraged to have supervision for mobility acutely. No further acute therapy needs as pt at or very near his baseline, will sign off with pt agreeable.    Follow Up Recommendations Outpatient PT    Equipment Recommendations  Cane    Recommendations for Other Services       Precautions / Restrictions Precautions Precautions: Fall      Mobility  Bed Mobility Overal bed mobility: Independent                Transfers Overall transfer level: Modified independent                  Ambulation/Gait Ambulation/Gait assistance: Supervision Gait Distance (Feet): 800 Feet Assistive device: None Gait Pattern/deviations: WFL(Within Functional Limits)   Gait velocity interpretation: >2.62 ft/sec, indicative of community ambulatory General Gait Details: pt with steady gait with clear hall ambulation and minimal head turns, unable to alter speed or perform rapid head turns  Financial trader Rankin (Stroke Patients Only)       Balance Overall balance assessment: Needs assistance   Sitting balance-Leahy Scale: Good       Standing balance-Leahy Scale: Good            R[place]berg - Eyes Opened: 59       Standardized Balance Assessment Standardized Balance Assessment : Berg Balance Test Berg Balance Test Sit to Stand: Able to stand without using hands and stabilize independently Standing Unsupported: Able to stand safely 2 minutes Sitting with Back Unsupported but Feet Supported on Floor or Stool: Able to sit safely and securely 2 minutes Stand to Sit: Sits safely with minimal use of hands Transfers: Able to transfer safely, minor use of hands Standing Unsupported with Eyes Closed: Able to stand 10 seconds with supervision Standing Ubsupported with Feet Together: Able to place feet together independently and stand 1 minute safely From Standing, Reach Forward with Outstretched Arm: Can reach forward >12 cm safely (5") From Standing Position, Pick up Object from Floor: Able to pick up shoe safely and easily From Standing Position, Turn to Look Behind Over each Shoulder: Looks behind one side only/other side shows less weight shift Turn 360 Degrees: Able to turn 360 degrees safely in 4 seconds or less Standing Unsupported, Alternately Place Feet on Step/Stool: Able to complete >2 steps/needs minimal assist Standing Unsupported, One Foot in Front: Needs help to step but can hold 15 seconds Standing on One Leg: Unable to try or needs assist to prevent fall Total Score: 43         Pertinent Vitals/Pain Pain Assessment: No/denies pain    Home Living Family/patient expects to be discharged to:: Private  residence Living Arrangements: Non-relatives/Friends Available Help at Discharge: Available PRN/intermittently Type of Home: Apartment Home Access: Level entry     Home Layout: One level Home Equipment: None Additional Comments: pt reports he doesn't have a place to live but bounces between friends apartments.    Prior Function Level of Independence: Independent               Hand Dominance        Extremity/Trunk Assessment   Upper  Extremity Assessment Upper Extremity Assessment: Overall WFL for tasks assessed    Lower Extremity Assessment Lower Extremity Assessment: Overall WFL for tasks assessed    Cervical / Trunk Assessment Cervical / Trunk Assessment: Normal  Communication   Communication: No difficulties  Cognition Arousal/Alertness: Awake/alert Behavior During Therapy: WFL for tasks assessed/performed Overall Cognitive Status: Impaired/Different from baseline Area of Impairment: Memory;Safety/judgement                     Memory: Decreased short-term memory   Safety/Judgement: Decreased awareness of deficits     General Comments: pt unable to recall president stating Obama then [place]. able to name 2/3 c animals, unable to spell world at all      General Comments      Exercises     Assessment/Plan    PT Assessment All further PT needs can be met in the next venue of care  PT Problem List Decreased balance;Decreased cognition;Decreased safety awareness       PT Treatment Interventions      PT Goals (Current goals can be found in the Care Plan section)  Acute Rehab PT Goals Patient Stated Goal: stop drinking PT Goal Formulation: All assessment and education complete, DC therapy    Frequency     Barriers to discharge        Co-evaluation               AM-PAC PT "6 Clicks" Mobility  Outcome Measure Help needed turning from your back to your side while in a flat bed without using bedrails?: None Help needed moving from lying on your back to sitting on the side of a flat bed without using bedrails?: None Help needed moving to and from a bed to a chair (including a wheelchair)?: None Help needed standing up from a chair using your arms (e.g., wheelchair or bedside chair)?: None Help needed to walk in hospital room?: A Little Help needed climbing 3-5 steps with a railing? : A Little 6 Click Score: 22    End of Session Equipment Utilized During Treatment: Gait  belt Activity Tolerance: Patient tolerated treatment well Patient left: in bed;with call bell/phone within reach;with bed alarm set Nurse Communication: Mobility status PT Visit Diagnosis: Other abnormalities of gait and mobility (R26.89)    Time: 1343-1406 PT Time Calculation (min) (ACUTE ONLY): 23 min   Charges:   PT Evaluation $PT Eval Moderate Complexity: 1 Mod          [place], PT Acute Rehabilitation Services Pager: [phone] Office: [place] 04/10/2020, 2:22 PM

## 2020-04-10 NOTE — Progress Notes (Addendum)
[facility]   [phone] [address] [place] [address]    Next Steps: Go on 04/30/2020    Instructions: Post hospital appointment arranged for 8/11 @ 10:30 am with Georgian Co PA. The clinic can assist with medication needs, financial counseling, and help establish primary care    Pt very interested in clinic. Post hospital appointment arranged by NCM and noted on AVS. NCM to provide Clinic's information prior to d/c. Gae Gallop RN,BSN,CM

## 2020-04-10 NOTE — Progress Notes (Signed)
Report called to receiving Rn [phone]. Patient with no complaints at the current time. Will transfer via WC.

## 2020-04-10 NOTE — Progress Notes (Signed)
NURSING PROGRESS NOTE  Tracie Lindbloom [id number] Transfer Data: 04/10/2020 6:48 PM Attending Provider: Reymundo Poll, MD TDD:[id number], No Pcp Per Code Status: FULL  Dennis Vega is a 59 y.o. male patient transferred from 2H  -No acute distress noted.  -No complaints of shortness of breath.  -No complaints of chest pain.   Cardiac Monitoring: Patient on bedside monitor Cardiac monitor yields:normal sinus rhythm.  Blood pressure (!) 165/99, pulse 80, temperature 98.6 F (37 C), temperature source Oral, resp. rate 18, height 5\' 9"  (1.753 m), weight 82.3 kg, SpO2 98 %.   IV Fluids:  IV in place, occlusive dsg intact without redness, IV cath forearm right, condition patent and no redness none.   Allergies:  Patient has no known allergies.  Past Medical History:   has a past medical history of Hypertension and Pneumonia.  Past Surgical History:   has a past surgical history that includes Hand surgery and ORIF mandibular fracture (Bilateral, 06/24/2019).  Social History:   reports that he has never smoked. He has never used smokeless tobacco. He reports current alcohol use of about 10.0 standard drinks of alcohol per week. He reports previous drug use. Drugs: "Crack" cocaine and Marijuana.  Skin: Intact  Patient orientated to room and information packet given to patient. Admission inpatient armband information verified with patient. Patient/family able to verbalize understanding of risk associated with falls and verbalized understanding to call for assistance before getting out of bed. Call light within reach.

## 2020-04-10 NOTE — Progress Notes (Addendum)
Subjective:   Dennis Vega is a 58yo male with PMH of hypertension and alcohol use who presented with active alcohol withdrawal. He was given ativan but ultimately had to be transferred to the ICU for precedex gtt and phenobarbitol pushes.   He has been off the precedex gtt since yesterday morning and has not required ativan since 8pm last night. CIWA this morning is 2. Today, he is feeling well but still shaky and weak. He denies nausea, shortness of breath, chest pain, difficulty with urination or bowel movements.   He is very interested in alcohol cessation as he feels much better now that he is through withdrawal.   Objective:  Vital signs in last 24 hours: Vitals:   04/10/20 0000 04/10/20 0100 04/10/20 0400 04/10/20 0425  BP: (!) 171/97 (!) 157/98 (!) 152/91 (!) 152/91  Pulse:   98   Resp: (!) 23 19 (!) 24 (!) 22  Temp: 98.1 F (36.7 C)     TempSrc: Axillary     SpO2: 100% 100% 100% 100%  Weight:      Height:        Constitution: NAD, appears stated age, tremulous Eyes: no icterus or injection Cardio: tachycardia, regular rhythm, no m/r/g, no LE edema  Respiratory: CTA, no w/r/r Abdominal: NTTP, soft, slightly distended, no palpable liver or spleen MSK: moving all extremities Neuro: normal affect, a&ox3 Skin: c/d/i   Assessment/Plan:  Active Problems:   Alcohol withdrawal (HCC)   Delirium tremens (HCC)  Dennis Vega is a 58yo male with PMH of hypertension and alcohol use who presented with active alcohol withdrawal.  Alcohol Withdrawal  Delirium Tremens - resolved Hypokalemia, Hypomagnesemia Patient much better today, last required 2mg ativan over 12 hours ago, CIWA this morning 2, which is partially just related to elevated blood pressure. Discussed alcohol cessation options, and he is very interested in quitting. Social work met with him yesterday to discuss cessation resources as well as housing options as he currently is homeless.   - patient to  establish PCP through social work - consider naltrexone, will get RUQ US to see if there is any cirrhosis. AST/ALT normal now ut slightly elevated at admission  - cont. Thiamine, folic acid, multivitamin - cont. ciwa w/ativan today, can likely stop ativan tomorrow.  - PT/OT  - Mg and K stable today.   Hypertension Not currently on any medications. The last of his withdrawal could still be contributing, but this is also a previous diagnosis. He was previously started on losartan 25 mg qd.   - start norvasc 5 mg.   Mild Leukopenia  Thrombocytopenia Macrocytosis  Macrocytosis, mild leukopania and thrombocytopenia are consistent with and likely secondary to his chronic alcohol use. If no splenic involvement, leukopenia should continue to resolve.   - cont folate, thiamine, and multivitamin.   VTE: lovenox IVF: none Diet: regular Code: full   Dispo: Anticipated discharge in approximately 1-2 days.    Seawell, Jaimie A, DO 04/10/2020, 7:00 AM Pager: [phone] After 5pm on weekdays and 1pm on weekends: On Call Pager: [phone]  .  

## 2020-04-11 ENCOUNTER — Inpatient Hospital Stay (HOSPITAL_COMMUNITY): Payer: Self-pay

## 2020-04-11 DIAGNOSIS — I10 Essential (primary) hypertension: Secondary | ICD-10-CM

## 2020-04-11 LAB — COMPREHENSIVE METABOLIC PANEL
ALT: 37 U/L (ref 0–44)
AST: 28 U/L (ref 15–41)
Albumin: 3.4 g/dL — ABNORMAL LOW (ref 3.5–5.0)
Alkaline Phosphatase: 48 U/L (ref 38–126)
Anion gap: 10 (ref 5–15)
BUN: 7 mg/dL (ref 6–20)
CO2: 23 mmol/L (ref 22–32)
Calcium: 9.5 mg/dL (ref 8.9–10.3)
Chloride: 102 mmol/L (ref 98–111)
Creatinine, Ser: 0.74 mg/dL (ref 0.61–1.24)
GFR calc Af Amer: >60 mL/min (ref >60)
GFR calc non Af Amer: >60 mL/min (ref >60)
Glucose, Bld: 104 mg/dL — ABNORMAL HIGH (ref 70–99)
Potassium: 3.4 mmol/L — ABNORMAL LOW (ref 3.5–5.1)
Sodium: 135 mmol/L (ref 135–145)
Total Bilirubin: 1.6 mg/dL — ABNORMAL HIGH (ref 0.3–1.2)
Total Protein: 6.2 g/dL — ABNORMAL LOW (ref 6.5–8.1)

## 2020-04-11 LAB — CBC WITH DIFFERENTIAL/PLATELET
Abs Immature Granulocytes: 0.01 10*3/uL (ref 0.00–0.07)
Basophils Absolute: 0 10*3/uL (ref 0.0–0.1)
Basophils Relative: 1 %
Eosinophils Absolute: 0.1 10*3/uL (ref 0.0–0.5)
Eosinophils Relative: 1 %
HCT: 41 % (ref 39.0–52.0)
Hemoglobin: 14.2 g/dL (ref 13.0–17.0)
Immature Granulocytes: 0 %
Lymphocytes Relative: 37 %
Lymphs Abs: 1.4 10*3/uL (ref 0.7–4.0)
MCH: 36.2 pg — ABNORMAL HIGH (ref 26.0–34.0)
MCHC: 34.6 g/dL (ref 30.0–36.0)
MCV: 104.6 fL — ABNORMAL HIGH (ref 80.0–100.0)
Monocytes Absolute: 0.7 10*3/uL (ref 0.1–1.0)
Monocytes Relative: 19 %
Neutro Abs: 1.5 10*3/uL — ABNORMAL LOW (ref 1.7–7.7)
Neutrophils Relative %: 42 %
Platelets: 167 10*3/uL (ref 150–400)
RBC: 3.92 MIL/uL — ABNORMAL LOW (ref 4.22–5.81)
RDW: 11.6 % (ref 11.5–15.5)
WBC: 3.7 10*3/uL — ABNORMAL LOW (ref 4.0–10.5)
nRBC: 0 % (ref 0.0–0.2)

## 2020-04-11 LAB — MAGNESIUM: Magnesium: 1.9 mg/dL (ref 1.7–2.4)

## 2020-04-11 MED ORDER — AMLODIPINE BESYLATE 5 MG PO TABS
5.0000 mg | ORAL_TABLET | Freq: Every day | ORAL | Status: DC
  Administered 2020-04-11 – 2020-04-12 (×2): 5 mg via ORAL
  Filled 2020-04-11 (×2): qty 1

## 2020-04-11 MED ORDER — AMLODIPINE BESYLATE 5 MG PO TABS: 5.0000 mg | ORAL_TABLET | Freq: Every day | ORAL | 0 refills | Status: DC

## 2020-04-11 MED ORDER — POTASSIUM CHLORIDE CRYS ER 20 MEQ PO TBCR
40.0000 meq | EXTENDED_RELEASE_TABLET | ORAL | Status: AC
  Administered 2020-04-11 (×2): 40 meq via ORAL
  Filled 2020-04-11 (×2): qty 2

## 2020-04-11 MED ORDER — NALTREXONE HCL 50 MG PO TABS
50.0000 mg | ORAL_TABLET | Freq: Every day | ORAL | 0 refills | Status: DC
Start: 2020-04-11 — End: 2022-01-23

## 2020-04-11 MED ORDER — MELATONIN 3 MG PO TABS
3.0000 mg | ORAL_TABLET | Freq: Every day | ORAL | Status: DC
  Administered 2020-04-11: 3 mg via ORAL
  Filled 2020-04-11: qty 1

## 2020-04-11 MED ORDER — THIAMINE HCL 100 MG PO TABS: 100.0000 mg | ORAL_TABLET | Freq: Every day | ORAL | 0 refills | Status: DC

## 2020-04-11 MED ORDER — FOLIC ACID 1 MG PO TABS: 1.0000 mg | ORAL_TABLET | Freq: Every day | ORAL | 0 refills | Status: DC

## 2020-04-11 MED FILL — NALTREXONE 50 MG TABLET: 50 | 30 days supply | Qty: 30 | Fill #0

## 2020-04-11 MED FILL — FOLIC ACID 1 MG TABS: 1 | 30 days supply | Qty: 30 | Fill #0

## 2020-04-11 MED FILL — VITAMIN B-1 100 MG TABS: 100 | 30 days supply | Qty: 30 | Fill #0

## 2020-04-11 MED FILL — AMLODIPINE BESYLATE 5 MG TA: 5 | 30 days supply | Qty: 30 | Fill #0

## 2020-04-11 NOTE — TOC Progression Note (Signed)
Transition of Care [facility]) - Progression Note    Patient Details  Name: Dennis Vega MRN: [id number] Date of Birth: September 26, 1960  Transition of Care [facility]) CM/SW Contact  Lockie Pares, RN Phone Number: 04/11/2020, 2:11 PM  Clinical Narrative:     MATCH provided for patient medications. Patient is looking to secure residence has been to [facility] and done research on housing. Has tried friends and is unsucessful at obtaining housing.  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     Barriers to Discharge: Homeless with medical needs, Inadequate or no insurance  Expected Discharge Plan and Services  Discharge to [facility]       Living arrangements for the past 2 months: No permanent address Expected Discharge Date: 04/11/20                                     Social Determinants of Health (SDOH) Interventions    Readmission Risk Interventions No flowsheet data found.

## 2020-04-11 NOTE — Progress Notes (Signed)
Occupational Therapy Evaluation Patient Details Name: Dennis Vega MRN: [id number] DOB: 1961-03-13 Today's Date: 04/11/2020    History of Present Illness 59 yo admitted with active alchohol withdrawal who required ICU admission for Precedex and phenobarbitol. PMhx: ETOH use, HTN   Clinical Impression   Patient is independent at PLOF.  He states he is currently homeless and does not have anyone in the area to stay with.  He was performing today at a supervision level for ADLs.  Some mild balance deficits with dynamic standing/walking.  Most of his steps were short and choppy.  Patient with decreased short term memory and problem solving skills.  Will continue to follow with OT acutely to increase safety with balance and cognition prior to discharge.    Follow Up Recommendations  No OT follow up    Equipment Recommendations  None recommended by OT    Recommendations for Other Services       Precautions / Restrictions Precautions Precautions: Fall Restrictions Weight Bearing Restrictions: No      Mobility Bed Mobility Overal bed mobility: Independent                Transfers Overall transfer level: Modified independent Equipment used: None                  Balance Overall balance assessment: Mild deficits observed, not formally tested                                         ADL either performed or assessed with clinical judgement   ADL Overall ADL's : Needs assistance/impaired Eating/Feeding: Independent;Sitting                                   Functional mobility during ADLs: Supervision/safety General ADL Comments: Patient supervision for all ADLs at this time due to mild dynamic balance deficits     Vision Baseline Vision/History:  (Needs glasses, does not have them) Patient Visual Report: Blurring of vision;Other (comment) (with far sight) Additional Comments: Patient states vision is blurrier farther away, but  that he also holds things far from face to read.  Cannot afford glasses.     Perception     Praxis      Pertinent Vitals/Pain Pain Assessment: No/denies pain     Hand Dominance Right   Extremity/Trunk Assessment Upper Extremity Assessment Upper Extremity Assessment: Overall WFL for tasks assessed   Lower Extremity Assessment Lower Extremity Assessment: Defer to PT evaluation   Cervical / Trunk Assessment Cervical / Trunk Assessment: Normal   Communication Communication Communication: No difficulties   Cognition Arousal/Alertness: Awake/alert Behavior During Therapy: WFL for tasks assessed/performed Overall Cognitive Status: No family/caregiver present to determine baseline cognitive functioning Area of Impairment: Memory;Safety/judgement;Problem solving                     Memory: Decreased short-term memory   Safety/Judgement: Decreased awareness of deficits   Problem Solving: Slow processing General Comments: Short term memory deficits and decreased problem solving skills. Unable to figure out where toothbrush kept in room   General Comments       Exercises     Shoulder Instructions      Home Living Family/patient expects to be discharged to:: Private residence Living Arrangements: Alone  Additional Comments: Patient reports he has been homeless, moslty staying on park benches/outside      Prior Functioning/Environment Level of Independence: Independent                 OT Problem List: Impaired balance (sitting and/or standing);Decreased cognition;Decreased safety awareness;Decreased activity tolerance      OT Treatment/Interventions: Self-care/ADL training;Therapeutic exercise;Cognitive remediation/compensation;Therapeutic activities;Balance training;Patient/family education    OT Goals(Current goals can be found in the care plan section) Acute Rehab OT Goals Patient Stated Goal: stop  drinking OT Goal Formulation: With patient Time For Goal Achievement: 04/25/20 Potential to Achieve Goals: Good  OT Frequency: Min 2X/week   Barriers to D/C: Other (comment);Inaccessible home environment (Homeless)          Co-evaluation              AM-PAC OT "6 Clicks" Daily Activity     Outcome Measure Help from another person eating meals?: None Help from another person taking care of personal grooming?: A Little Help from another person toileting, which includes using toliet, bedpan, or urinal?: A Little Help from another person bathing (including washing, rinsing, drying)?: A Little Help from another person to put on and taking off regular upper body clothing?: A Little Help from another person to put on and taking off regular lower body clothing?: A Little 6 Click Score: 19   End of Session Nurse Communication: Mobility status  Activity Tolerance: Patient tolerated treatment well Patient left: in bed;with call bell/phone within reach  OT Visit Diagnosis: Unsteadiness on feet (R26.81);Other symptoms and signs involving cognitive function                Time: 7989-2119 OT Time Calculation (min): 14 min Charges:  OT General Charges $OT Visit: 1 Visit OT Evaluation $OT Eval Moderate Complexity: 1 [address], OTR/L   Adella Hare 04/11/2020, 1:06 PM

## 2020-04-11 NOTE — Discharge Summary (Signed)
Name: Dennis Vega MRN: [id number] DOB: [birth year] 59 y.o. PCP: Patient, No Pcp Per  Date of Admission: 04/05/2020  7:23 PM Date of Discharge: 04/12/2020 Attending Physician: Reymundo Poll, MD  Discharge Diagnosis: 1. Alcohol Withdrawal 2. Hypertension 3. Unhomed  Discharge Medications: Allergies as of 04/12/2020   No Known Allergies     Medication List    TAKE these medications   amLODipine 5 MG tablet Commonly known as: NORVASC Take 1 tablet (5 mg total) by mouth daily.   chlordiazePOXIDE 25 MG capsule Commonly known as: LIBRIUM 50mg  PO TID x 1D, then 25-50mg  PO BID X 1D, then 25-50mg  PO QD X 1D   folic acid 1 MG tablet Commonly known as: FOLVITE Take 1 tablet (1 mg total) by mouth daily.   ibuprofen 200 MG tablet Commonly known as: ADVIL Take 200 mg by mouth daily as needed for moderate pain.   naltrexone 50 MG tablet Commonly known as: DEPADE Take 1 tablet (50 mg total) by mouth daily.   thiamine 100 MG tablet Take 1 tablet (100 mg total) by mouth daily.       Disposition and follow-up:   Mr.Dennis Vega was discharged from [facility] in Stable condition.  At the hospital follow up visit please address:  1.  Alcohol Withdrawal: Discharged w naltrexone 50 mg and a librium taper. Also advised to continue thiamine and folic acid. Encourage cessation.  Hypertension: Started on amlodipine 5mg  qd Unhomed: Provided information for local shelters.    2.  Labs / imaging needed at time of follow-up: Potassium  3.  Pending labs/ test needing follow-up: none  Follow-up Appointments:  Follow-up Information    [facility].   Contact information: [phone]       [facility]. Go on 04/30/2020.   Why: Post hospital appointment arranged  for 8/11 @ 10:30 am with 06/30/2020 PA. The clinic can assist with medication needs, financial counseling, and  help establish primary care Contact information: [address]  [address] [place] [address] [phone]              Hospital Course by problem list: 1. Alcohol Withdrawal 2. Hypertension 3. Unhomed  Dennis Vega is a 59yo male with PMH of hypertension and ethanol use who presented with full body tremors, found to be in alcohol withdrawal. He was started on ativan but continued to have worsening alcohol withdrawal and worsening delirium and was transferred to the ICU for precedex drip and also received phenobarbital pushes. Mentation improved and he was transferred back to the floor. He was interested in alcohol cessation and RUQ Dennis Vega was done, which showed hepatic steatosis . He was started on Naltrexone 50 mg qd. Also discharged with a librium taper.  He had persistent hypertension after cessation of withdrawal and was started on 5 mg of amlodipine.  Patient is homeless. He received information for local shelters prior to discharge and was scheduled with a follow-up to establish with a PCP locally.   Discharge Vitals:   BP 107/82   Pulse 70   Temp 98.6 F (37 C) (Oral)   Resp 20   Ht 5\' 9"  (1.753 m)   Wt 83.6 kg   SpO2 96%   BMI 27.22 kg/m   Pertinent Labs, Studies, and Procedures:   CMP Latest Ref Rng & Units 04/12/2020 04/11/2020 04/10/2020  Glucose 70 - 99 mg/dL 04/14/2020) 04/13/2020) 04/12/2020)  BUN 6 - 20 mg/dL 6 7 7   Creatinine 0.61 -  1.24 mg/dL 9.16 9.45 0.38  Sodium 135 - 145 mmol/L 135 135 135  Potassium 3.5 - 5.1 mmol/L 4.0 3.4(L) 4.0  Chloride 98 - 111 mmol/L 102 102 103  CO2 22 - 32 mmol/L 24 23 23   Calcium 8.9 - 10.3 mg/dL 9.7 9.5 9.3  Total Protein 6.5 - 8.1 g/dL - 6.2(L) 6.4(L)  Total Bilirubin 0.3 - 1.2 mg/dL - 1.6(H) 1.9(H)  Alkaline Phos 38 - 126 U/L - 48 48  AST 15 - 41 U/L - 28 32  ALT 0 - 44 U/L - 37 36   CBC Latest Ref Rng & Units 04/11/2020 04/08/2020 04/07/2020  WBC 4.0 - 10.5 K/uL 3.7(L) 3.9(L) 3.2(L)  Hemoglobin 13.0 - 17.0 g/dL 04/09/2020 88.2 80.0  Hematocrit 39 - 52 % 41.0 47.4 43.4  Platelets  150 - 400 K/uL 167 140(L) 133(L)   RUQ u/s:  IMPRESSION: 1. Hepatic steatosis without discrete lesion or significant ascites. 2. Normal sonographic appearance of the gallbladder common bile duct.  Discharge Instructions: Discharge Instructions    Call MD for:  difficulty breathing, headache or visual disturbances   Complete by: As directed    Call MD for:  difficulty breathing, headache or visual disturbances   Complete by: As directed    Call MD for:  extreme fatigue   Complete by: As directed    Call MD for:  hives   Complete by: As directed    Call MD for:  persistant dizziness or light-headedness   Complete by: As directed    Call MD for:  persistant nausea and vomiting   Complete by: As directed    Call MD for:  persistant nausea and vomiting   Complete by: As directed    Call MD for:  redness, tenderness, or signs of infection (pain, swelling, redness, odor or green/yellow discharge around incision site)   Complete by: As directed    Call MD for:  severe uncontrolled pain   Complete by: As directed    Call MD for:  severe uncontrolled pain   Complete by: As directed    Call MD for:  temperature >100.4   Complete by: As directed    Call MD for:  temperature >100.4   Complete by: As directed    Diet - low sodium heart healthy   Complete by: As directed    Diet - low sodium heart healthy   Complete by: As directed    Discharge instructions   Complete by: As directed    You were hospitalized for alcohol withdrawal. Thank you for allowing [facility] to be part of your care.   Please note these changes made to your medications:   Please START  Naltrexone 50 mg - take one tablet per day   Amlodipine 5mg  - take one tablet per day   Thiamine - take one tablet per day   Increase activity slowly   Complete by: As directed    Increase activity slowly   Complete by: As directed       Signed: [place], MD 04/12/2020, 3:59 PM   Pager: [phone]

## 2020-04-11 NOTE — Progress Notes (Signed)
Patient was supposed to be NPO for RUQ [place] however during assessment, patient stated he ate his breakfast. Ultrasound called to notify. Will keep patient NPO from now. Patient re-educated.

## 2020-04-11 NOTE — Plan of Care (Signed)
  Problem: Coping: Goal: Level of anxiety will decrease Outcome: Progressing   Problem: Safety: Goal: Ability to remain free from injury will improve Outcome: Progressing   Problem: Education: Goal: Knowledge of disease or condition will improve Outcome: Progressing   Patient remained anxious, pacing back and forth in the room. Patient was constantly redirected and reoriented, CIWA protocol implemented and was effective.

## 2020-04-11 NOTE — Progress Notes (Signed)
Subjective:   Dennis Vega is a 59yo male with PMH of hypertension and alcohol use who presented with active alcohol withdrawal. He was given ativan but ultimately had to be transferred to the ICU for precedex gtt and phenobarbitol pushes. He was transferred back to IMTS after transitioning off Precedex on 7/22.   He has been off the precedex gtt since 7/21 morning and has not required ativan since 1am this morning for which patient states he was given to help him sleep. CIWA this morning is 2. Today, he is feeling well and has no complaints. He denies nausea, chest pain, and headache.   He is very interested in alcohol cessation as he feels much better now that he is through withdrawal. Discussed plan for liver US to assess for cirrhosis and patient was informed that if the scan is unremarkable, he could start taking Naltrexone to help with alcohol cessation. Patient voiced understanding and expressed interest in taking Naltrexone.  Also discussed possible discharge later today and patient voiced that he would be in agreement if deemed stable to leave today.   Objective:  Vital signs in last 24 hours: Vitals:   04/10/20 2349 04/11/20 0500 04/11/20 1047 04/11/20 1218  BP: (!) 165/100  (!) 160/96 (!) 157/92  Pulse: 69     Resp: 18  16 19   Temp: 98.2 F (36.8 C)  98.7 F (37.1 C) 97.9 F (36.6 C)  TempSrc: Oral  Oral Oral  SpO2: 100%   98%  Weight:  84 kg    Height:        Constitution: NAD, appears stated age, laying comfortably in bed Eyes: no icterus or injection Cardio: regular rate and rhythm, no m/r/g, no LE edema  Respiratory: CTA, no w/r/r Abdominal: NTTP, soft, slightly distended, no palpable liver or spleen MSK: moving all extremities Neuro: normal affect, a&ox3, no tremor Skin: c/d/i   Assessment/Plan:  Active Problems:   Alcohol withdrawal (HCC)   Delirium tremens (HCC)   Alcohol abuse   Elevated AST (SGOT)  Dennis Vega is a 59yo male with PMH of  hypertension and alcohol use who presented with active alcohol withdrawal.   Alcohol Withdrawal  Delirium Tremens - resolved Hypokalemia, Hypomagnesemia - stable Patient was transferred back to IMTS after transitioning off Precedex on 7/22. Patient continues to felel well today, last required 2mg  ativan ~12 hours ago for what the patient reports was given as a sleep aid. CIWA this morning 2 for tremor and anxiety, though neither were observed during interview this morning. Overall, withdrawal appears to have fully subsided with resolution of tremor and much improved mentation. Patient has a strong desire to quit alcohol use and is very interested in trialing Naltrexone, so pending a RUQ 8/22 to r/o cirrhosis, he can start the medication. Medically, he is now stable, so pending Social work who are assessing housing options for him, he can discharge later today. - Pending RUQ to see if there is any cirrhosis. If none, will start Naltrexone to promote continued alcohol cessation.  - AST/ALT pending, have remained normal and were previously slightly elevated at admission  - Mg and K pending, stable yesterday - cont. Thiamine, folic acid, multivitamin - Discontinue ciwa w/ ativan given continued improvement and resolution of tremor - Patient to establish PCP through social work  Hypertension Has a hx of hypertension, not currently on any medications. Now that his withdrawal appears to have subsided, persistent elevated pressures are likely related to underlying HTN. He was previously  started on losartan 25 mg qd.  -Start norvasc 5 mg  Mild Leukopenia  Thrombocytopenia Macrocytosis  Macrocytosis, mild leukopania and thrombocytopenia are consistent with and likely secondary to his chronic alcohol use. If no splenic involvement, leukopenia should continue to resolve.  - cont folate, thiamine, and multivitamin.   VTE: lovenox IVF: none Diet: regular Code: full   Dispo: Anticipated discharge in  approximately 0-1 day.    Consuelo Pandy, Medical Student 04/11/2020, 12:58 PM Pager: [phone] After 5pm on weekdays and 1pm on weekends: On Call pager [phone]

## 2020-04-12 DIAGNOSIS — F10230 Alcohol dependence with withdrawal, uncomplicated: Secondary | ICD-10-CM

## 2020-04-12 DIAGNOSIS — I10 Essential (primary) hypertension: Secondary | ICD-10-CM

## 2020-04-12 DIAGNOSIS — D696 Thrombocytopenia, unspecified: Secondary | ICD-10-CM

## 2020-04-12 LAB — BASIC METABOLIC PANEL
Anion gap: 9 (ref 5–15)
BUN: 6 mg/dL (ref 6–20)
CO2: 24 mmol/L (ref 22–32)
Calcium: 9.7 mg/dL (ref 8.9–10.3)
Chloride: 102 mmol/L (ref 98–111)
Creatinine, Ser: 0.92 mg/dL (ref 0.61–1.24)
GFR calc Af Amer: >60 mL/min (ref >60)
GFR calc non Af Amer: >60 mL/min (ref >60)
Glucose, Bld: 172 mg/dL — ABNORMAL HIGH (ref 70–99)
Potassium: 4 mmol/L (ref 3.5–5.1)
Sodium: 135 mmol/L (ref 135–145)

## 2020-04-12 MED ORDER — NALTREXONE HCL 50 MG PO TABS
50.0000 mg | ORAL_TABLET | Freq: Every day | ORAL | Status: DC
  Administered 2020-04-12: 50 mg via ORAL
  Filled 2020-04-12: qty 1

## 2020-04-12 NOTE — Discharge Instructions (Signed)
Dennis Vega,   It has been a pleasure working with you and we are glad you're feeling better. You were hospitalized for alcohol withdrawal. I am glad that you are feeling better. Please continue taking the Librium as prescribed, this is helping with your withdrawal symptoms.   We also started you on a medication called Naltrexone, this can help with decreasing alcohol cravings and help you stop drinking.    We also started you on a medication for high blood pressure called amlodipine.   Follow up with your primary care provider in 1-2 weeks  If your symptoms worsen or you develop new symptoms, please seek medical help whether it is your primary care provider or emergency department.

## 2020-04-12 NOTE — Progress Notes (Signed)
   Subjective: Patient reports that he is feeling well today, no acute events overnight.  He denies any headaches or other issues.  We discussed the results of his right upper quadrant ultrasound, no evidence of cirrhosis however it showed steatosis.  We discussed that we would be able to start new medication for alcohol use disorder still interested in starting this.    Objective:  Vital signs in last 24 hours: Vitals:   04/11/20 1047 04/11/20 1218 04/11/20 2112 04/12/20 0533  BP: (!) 160/96 (!) 157/92 (!) 141/87 125/76  Pulse:  72 75 70  Resp: 16 19 (!) 25 17  Temp: 98.7 F (37.1 C) 97.9 F (36.6 C) 98 F (36.7 C) 97.8 F (36.6 C)  TempSrc: Oral Oral Oral Oral  SpO2:  98% 95% 96%  Weight:    83.6 kg  Height:       General: Middle-aged male, no acute distress, sitting up in bed Cardiac: Regular rate and rhythm, no murmurs rubs or gallops Respiratory: CTA BL, no wheezing, rhonchi, rales Neuro: Awake, oriented, moves all extremities Psych: Calm, normal mood and affect  Assessment/Plan:  Active Problems:   Alcohol withdrawal (HCC)   Delirium tremens (HCC)   Alcohol abuse   Transaminitis   Elevated blood pressure reading with diagnosis of hypertension  59 year old male with a history of hypertension and alcohol abuse who presented with alcohol withdrawal.  Alcohol Withdrawal  Delirium Tremens - resolved Hypokalemia, Hypomagnesemia - stable Patient was transferred back to IMTS after transitioning off Precedex on 7/22. Patient continues to feel well today. Ativan was discontinued yesterday. Given his history of heavy EtOH use and desire to quit we had discussed naltrexone. RUQ showed no evidence of cirrhosis. Will start naltrexone today and have him follow up with his PCP to assess how this is working.   - Naltrexone 50 mg daily - cont. Thiamine, folic acid, multivitamin - Patient to establish PCP through social work - Anticipated discharge later today  Hypertension Has  a hx of hypertension, not currently on any medications. Now that his withdrawal appears to have subsided, persistent elevated pressures are likely related to underlying HTN. Amlodipine 5 mg daily was started yesterday, Bps already improved. Will need to follow up with PCP to further address this.   -Continue norvasc 5 mg  Mild Leukopenia  Thrombocytopenia Macrocytosis  Macrocytosis, mild leukopania and thrombocytopenia are consistent with and likely secondary to his chronic alcohol use. - cont folate, thiamine, and multivitamin.   Prior to Admission Living Arrangement: Unhomed Anticipated Discharge Location: Unhomed Barriers to Discharge: None Dispo: Anticipated discharge in approximately today.   Claudean Severance, MD 04/12/2020, 6:44 AM Pager: [phone] After 5pm on weekdays and 1pm on weekends: On Call pager [phone]

## 2020-04-12 NOTE — Progress Notes (Signed)
Pt povided with bus pass from CM for discharge. All questions answered at this time and belongings with pt. Pt ambulated with this RN out of hospital to catch the bus.

## 2020-04-16 ENCOUNTER — Encounter (HOSPITAL_COMMUNITY): Payer: Self-pay | Admitting: *Deleted

## 2020-04-16 ENCOUNTER — Emergency Department (HOSPITAL_COMMUNITY)
Admission: EM | Admit: 2020-04-16 | Discharge: 2020-04-17 | Disposition: A | Discharge status: Home / Self Care | Payer: Self-pay | Attending: Emergency Medicine | Admitting: Emergency Medicine

## 2020-04-16 ENCOUNTER — Other Ambulatory Visit: Payer: Self-pay

## 2020-04-16 DIAGNOSIS — R5381 Other malaise: Secondary | ICD-10-CM | POA: Insufficient documentation

## 2020-04-16 DIAGNOSIS — Z5321 Procedure and treatment not carried out due to patient leaving prior to being seen by health care provider: Secondary | ICD-10-CM | POA: Insufficient documentation

## 2020-04-16 NOTE — ED Notes (Signed)
Not visible in lobby and recalled for vitals and for registration with no response

## 2020-04-16 NOTE — ED Triage Notes (Signed)
Pt very vague. Reports getting an automated call today and was told to come to the hospital for a follow up. Pt has no complaints, denies any acute symptoms. He is a&ox4. Pt was recently admitted on 7/17 for withdrawals, pt denies any alcohol use since then. No distress noted at triage.

## 2020-04-30 ENCOUNTER — Ambulatory Visit: Payer: Self-pay | Attending: Physician Assistant | Admitting: Physician Assistant

## 2020-04-30 ENCOUNTER — Other Ambulatory Visit: Payer: Self-pay

## 2021-10-28 ENCOUNTER — Emergency Department (HOSPITAL_COMMUNITY)
Admission: EM | Admit: 2021-10-28 | Discharge: 2021-10-28 | Disposition: A | Discharge status: Home / Self Care | Payer: Self-pay | Attending: Emergency Medicine | Admitting: Emergency Medicine

## 2021-10-28 ENCOUNTER — Encounter (HOSPITAL_COMMUNITY): Payer: Self-pay | Admitting: Emergency Medicine

## 2021-10-28 ENCOUNTER — Emergency Department (HOSPITAL_COMMUNITY): Payer: Self-pay

## 2021-10-28 ENCOUNTER — Other Ambulatory Visit: Payer: Self-pay

## 2021-10-28 DIAGNOSIS — S0181XA Laceration without foreign body of other part of head, initial encounter: Secondary | ICD-10-CM | POA: Insufficient documentation

## 2021-10-28 DIAGNOSIS — M542 Cervicalgia: Secondary | ICD-10-CM | POA: Insufficient documentation

## 2021-10-28 DIAGNOSIS — Z23 Encounter for immunization: Secondary | ICD-10-CM | POA: Insufficient documentation

## 2021-10-28 DIAGNOSIS — W06XXXA Fall from bed, initial encounter: Secondary | ICD-10-CM | POA: Insufficient documentation

## 2021-10-28 DIAGNOSIS — S01111A Laceration without foreign body of right eyelid and periocular area, initial encounter: Secondary | ICD-10-CM | POA: Insufficient documentation

## 2021-10-28 DIAGNOSIS — M25521 Pain in right elbow: Secondary | ICD-10-CM | POA: Insufficient documentation

## 2021-10-28 DIAGNOSIS — S0990XA Unspecified injury of head, initial encounter: Secondary | ICD-10-CM

## 2021-10-28 MED ORDER — IBUPROFEN 200 MG PO TABS
600.0000 mg | ORAL_TABLET | Freq: Once | ORAL | Status: DC
  Filled 2021-10-28: qty 3

## 2021-10-28 MED ORDER — BACITRACIN ZINC 500 UNIT/GM EX OINT
TOPICAL_OINTMENT | Freq: Two times a day (BID) | CUTANEOUS | Status: DC
  Filled 2021-10-28: qty 0.9

## 2021-10-28 MED ORDER — ACETAMINOPHEN 325 MG PO TABS
650.0000 mg | ORAL_TABLET | Freq: Once | ORAL | Status: AC
  Administered 2021-10-28: 650 mg via ORAL
  Filled 2021-10-28: qty 2

## 2021-10-28 MED ORDER — IBUPROFEN 200 MG PO TABS
ORAL_TABLET | ORAL | Status: AC
  Filled 2021-10-28: qty 1

## 2021-10-28 MED ORDER — LIDOCAINE-EPINEPHRINE (PF) 2 %-1:200000 IJ SOLN
10.0000 mL | Freq: Once | INTRAMUSCULAR | Status: AC
  Administered 2021-10-28: 10 mL
  Filled 2021-10-28: qty 20

## 2021-10-28 MED ORDER — TETANUS-DIPHTH-ACELL PERTUSSIS 5-2.5-18.5 LF-MCG/0.5 IM SUSY
0.5000 mL | PREFILLED_SYRINGE | Freq: Once | INTRAMUSCULAR | Status: AC
  Administered 2021-10-28: 0.5 mL via INTRAMUSCULAR
  Filled 2021-10-28: qty 0.5

## 2021-10-28 NOTE — ED Triage Notes (Signed)
BIB [facility] in [place], supposedly had a fall, has a L eyebrow laceration, reports neck pain and R arm pain from fall. No obvious deformity. Alert and oriented x4 w/ EMS. Denies LOC.

## 2021-10-28 NOTE — Discharge Instructions (Addendum)
Sutured repair Because of the nature of the laceration that you suffered to her left forehead you have had some sutures placed however there is also an area that is not sutured that will simply have to scab and heal on its own.  There will be a scar there.  Keep the laceration site dry for the next 24 hours and leave the dressing in place. After 24 hours you may remove the dressing and gently clean the laceration site with antibacterial soap and warm water. Do not scrub the area. Do not soak the area and water for long periods of time. Dont use hydrogen peroxide, iodine-based solutions, or alcohol, which can slow healing, and will probably be painful! Apply topical bacitracin 1-2 times per day for the next 3-5 days.  The sutures I have placed will fall off on their own.  Keep the area clean and covered.  Use warm soapy water to keep it clean and dab dry and place a thin layer of bacitracin over the wound.  You should return sooner for any signs of infection which would include increased redness around the wound, increased swelling, new drainage of yellow pus.

## 2021-10-28 NOTE — ED Provider Notes (Signed)
[facility] DEPT Provider Note   CSN: [id number] Arrival date & time: 10/28/21  [id number]     History  Chief Complaint  Patient presents with   Fall   Laceration    Libero Rissmiller is a 61 y.o. male.   Fall  Laceration  Patient is a 61 year old homeless gentleman staying currently in a homeless shelter in [place] who presents emergency room today after falling out of his bed states that he was having a vivid dream.  States that he woke up "in the air "says he is having right elbow pain, neck pain and mild headache.  He states he did not lose consciousness after hitting the ground.  No nausea or vomiting.  Denies any other associated pain or symptoms.  He has had some bleeding from the laceration on his left forehead above his eyebrow he states is not particularly painful however.     Home Medications Prior to Admission medications   Medication Sig Start Date End Date Taking? Authorizing Provider  amLODipine (NORVASC) 5 MG tablet Take 1 tablet (5 mg total) by mouth daily. 04/12/20   Seawell, Jaimie A, DO  chlordiazePOXIDE (LIBRIUM) 25 MG capsule 50mg  PO TID x 1D, then 25-50mg  PO BID X 1D, then 25-50mg  PO QD X 1D 123456   Delora Fuel, MD  folic acid (FOLVITE) 1 MG tablet Take 1 tablet (1 mg total) by mouth daily. 04/12/20   Seawell, Jaimie A, DO  ibuprofen (ADVIL) 200 MG tablet Take 200 mg by mouth daily as needed for moderate pain.    [provider]  naltrexone (DEPADE) 50 MG tablet Take 1 tablet (50 mg total) by mouth daily. 04/11/20   Seawell, Jaimie A, DO  thiamine 100 MG tablet Take 1 tablet (100 mg total) by mouth daily. 04/12/20   Seawell, Jaimie A, DO      Allergies    Patient has no known allergies.    Review of Systems   Review of Systems  Physical Exam Updated Vital Signs BP (!) 154/88    Pulse 60    Temp 98.3 F (36.8 C) (Oral)    Resp 16    Ht 5\' 9"  (1.753 m)    Wt 82 kg    SpO2 98%    BMI 26.70 kg/m  Physical  Exam Vitals and nursing note reviewed.  Constitutional:      General: He is not in acute distress.    Comments: Pleasant well-appearing 61 year old.  In no acute distress.  Sitting comfortably in bed.  Able answer questions appropriately follow commands. No increased work of breathing. Speaking in full sentences.   HENT:     Head: Normocephalic and atraumatic.     Nose: Nose normal.     Mouth/Throat:     Mouth: Mucous membranes are moist.  Eyes:     General: No scleral icterus. Cardiovascular:     Rate and Rhythm: Normal rate and regular rhythm.     Pulses: Normal pulses.     Heart sounds: Normal heart sounds.  Pulmonary:     Effort: Pulmonary effort is normal. No respiratory distress.     Breath sounds: No wheezing.  Abdominal:     Palpations: Abdomen is soft.     Tenderness: There is no abdominal tenderness.  Musculoskeletal:     Cervical back: Normal range of motion.     Right lower leg: No edema.     Left lower leg: No edema.     Comments: Right elbow tenderness  to palpation no focal bony tenderness.  Tenderness to palpation of the midline C-spine c-collar is currently in place.  No T or L-spine tenderness  No OTHER bony tenderness over joints or long bones of the upper and lower extremities.    Full range of motion of upper and lower extremity joints shown after palpation was conducted; with 5/5 symmetrical strength in upper and lower extremities. No chest wall tenderness, no facial or cranial tenderness.   Patient has intact sensation grossly in lower and upper extremities. Intact patellar and ankle reflexes. Patient able to ambulate without difficulty.  Radial and DP pulses palpated BL.     Skin:    General: Skin is warm and dry.     Capillary Refill: Capillary refill takes less than 2 seconds.     Comments: Laceration to forehead above left eyebrow  Neurological:     Mental Status: He is alert. Mental status is at baseline.  Psychiatric:        Mood and Affect:  Mood normal.        Behavior: Behavior normal.    ED Results / Procedures / Treatments   Labs (all labs ordered are listed, but only abnormal results are displayed) Labs Reviewed - No data to display  EKG None  Radiology DG Elbow Complete Right  Result Date: 10/28/2021 CLINICAL DATA:  Fall. Elbow pain. Right elbow pain after falling out of bed. EXAM: RIGHT ELBOW - COMPLETE 3+ VIEW COMPARISON:  None. FINDINGS: Normal bone mineralization. No joint effusion. Minimal degenerative spurring at the medial elbow joint. Joint spaces are preserved. No acute fracture or dislocation. IMPRESSION: No acute fracture. Electronically Signed   By: Yvonne Kendall M.D.   On: 10/28/2021 08:49   CT HEAD WO CONTRAST (5MM)  Result Date: 10/28/2021 CLINICAL DATA:  Head trauma, moderate-severe EXAM: CT HEAD WITHOUT CONTRAST TECHNIQUE: Contiguous axial images were obtained from the base of the skull through the vertex without intravenous contrast. RADIATION DOSE REDUCTION: This exam was performed according to the departmental dose-optimization program which includes automated exposure control, adjustment of the mA and/or kV according to patient size and/or use of iterative reconstruction technique. COMPARISON:  None. FINDINGS: Brain: There is no acute intracranial hemorrhage, mass effect, or edema. Gray-white differentiation is preserved. There is no extra-axial fluid collection. Ventricles and sulci are within normal limits in size and configuration. Patchy hypoattenuation in the supratentorial white matter is nonspecific but probably reflects chronic microvascular ischemic changes. 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 Vascular: No hyperdense vessel or unexpected calcification. Skull: Calvarium is unremarkable. Sinuses/Orbits: No acute finding. Other: None. IMPRESSION: No evidence of acute intracranial injury. Electronically Signed   By: Macy Mis M.D.   On:  10/28/2021 08:36   CT Cervical Spine Wo Contrast  Result Date: 10/28/2021 CLINICAL DATA:  Neck trauma, midline tenderness (Age 18-64y) EXAM: CT CERVICAL SPINE WITHOUT CONTRAST TECHNIQUE: Multidetector CT imaging of the cervical spine was performed without intravenous contrast. Multiplanar CT image reconstructions were also generated. RADIATION DOSE REDUCTION: This exam was performed according to the departmental dose-optimization program which includes automated exposure control, adjustment of the mA and/or kV according to patient size and/or use of iterative reconstruction technique. COMPARISON:  None. FINDINGS: Alignment: Preserved. Skull base and vertebrae: Vertebral body heights are maintained. No acute fracture. Soft tissues and spinal canal: No prevertebral fluid or swelling. No visible canal hematoma. Disc levels: Multilevel degenerative changes are present including disc space narrowing, endplate  osteophytes, and facet and uncovertebral hypertrophy. Canal stenosis ranges from mild to moderate. Foraminal narrowing is greatest on the right at C6-C7. Upper chest: No apical lung mass. Other: Unremarkable. IMPRESSION: No acute cervical spine fracture. Electronically Signed   By: Macy Mis M.D.   On: 10/28/2021 08:40    Procedures .Marland KitchenLaceration Repair  Date/Time: 10/28/2021 9:39 AM Performed by: Tedd Sias, PA Authorized by: Tedd Sias, PA   Consent:    Consent obtained:  Verbal   Consent given by:  Patient   Risks discussed:  Infection, need for additional repair, pain, poor cosmetic result and poor wound healing   Alternatives discussed:  No treatment and delayed treatment Universal protocol:    Procedure explained and questions answered to patient or proxy's satisfaction: yes     Relevant documents present and verified: yes     Test results available: yes     Imaging studies available: yes     Required blood products, implants, devices, and special equipment available: yes      Site/side marked: yes     Immediately prior to procedure, a time out was called: yes     Patient identity confirmed:  Verbally with patient Anesthesia:    Anesthesia method:  Local infiltration   Local anesthetic:  Lidocaine 2% WITH epi Laceration details:    Location:  Face   Face location:  L eyebrow   Length (cm):  2 Exploration:    Hemostasis achieved with:  Direct pressure and epinephrine   Imaging obtained comment:  CT   Wound extent: no foreign bodies/material noted and no tendon damage noted     Contaminated: no   Treatment:    Area cleansed with:  Saline   Amount of cleaning:  Standard   Irrigation solution:  Sterile saline   Irrigation volume:  Copious   Irrigation method:  Pressure wash   Visualized foreign bodies/material removed: no     Debridement:  Minimal Skin repair:    Repair method:  Sutures   Suture size:  5-0   Suture material:  Plain gut   Suture technique:  Simple interrupted   Number of sutures:  2 Approximation:    Approximation:  Close Repair type:    Repair type:  Intermediate Post-procedure details:    Dressing:  Antibiotic ointment and non-adherent dressing   Procedure completion:  Tolerated well, no immediate complications    Medications Ordered in ED Medications  bacitracin ointment (has no administration in time range)  acetaminophen (TYLENOL) tablet 650 mg (650 mg Oral Given 10/28/21 0800)  Tdap (BOOSTRIX) injection 0.5 mL (0.5 mLs Intramuscular Given 10/28/21 0759)  lidocaine-EPINEPHrine (XYLOCAINE W/EPI) 2 %-1:200000 (PF) injection 10 mL (10 mLs Infiltration Given by Other 10/28/21 0815)    ED Course/ Medical Decision Making/ A&P Clinical Course as of 10/28/21 0940  Wed Oct 28, 2021  0900 CT head and C-spine without intercranial abnormality or c-spine fracture.  [WF]  [id number] Elbow NAF [WF]    Clinical Course User Index [WF] Tedd Sias, PA                           Medical Decision Making Amount and/or Complexity of Data  Reviewed Radiology: ordered.  Risk OTC drugs.   Patient is 61 year old gentleman presented to the ER today after falling out of bed and suffering a laceration to his left forehead his primary concern is his neck pain he is currently in a c-collar does  have some posterior neck tenderness through c-collar.  We will maintain c-collar and obtain CT C-spine, CT head and also x-ray of right elbow since he is somewhat tender over the right elbow and complains of pain here as well.  Laceration has a small area that could be repaired however there is also a skin avulsion that will require wound care and healing over time.  Bleeding is minimal.  I personally reviewed CT C-spine, CT head, plain film right elbow no fractures of elbow or dislocation.  CT head and C-spine without bony abnormality or intracranial hemorrhage.  Pressure irrigation performed. Wound explored and base of wound visualized in a bloodless field without evidence of foreign body.  Laceration occurred < 8 hours prior to repair which was well tolerated. Tdap updated.  Pt has  no comorbidities to effect normal wound healing. Pt discharged without antibiotics.  Discussed suture home care with patient and answered questions. Pt to follow-up for wound check in 7 days; they are to return to the ED sooner for signs of infection. Pt is hemodynamically stable with no complaints prior to dc.    Absorbable sutures used.  We did shared decision-making conversation about work-up today and he feels that his concerns were addressed.  Given Tylenol for pain.  Patient tolerated suturing well.  Final Clinical Impression(s) / ED Diagnoses Final diagnoses:  Injury of head, initial encounter  Laceration of forehead, initial encounter  Neck pain  Right elbow pain    Rx / DC Orders ED Discharge Orders     None         Tedd Sias, [place] 10/28/21 0940    Godfrey Pick, MD 10/31/21 1731

## 2021-10-28 NOTE — ED Notes (Signed)
Pt reports hx of HTN

## 2021-10-28 NOTE — ED Notes (Signed)
Pt ambulatory with minimal staff assistance.

## 2021-10-28 NOTE — ED Notes (Signed)
I provided reinforced discharge education based off of discharge instructions. Pt acknowledged and understood my education. Pt had no further questions/concerns for provider/myself.  °

## 2022-01-22 ENCOUNTER — Emergency Department (HOSPITAL_COMMUNITY): Payer: Self-pay

## 2022-01-22 ENCOUNTER — Other Ambulatory Visit: Payer: Self-pay

## 2022-01-22 ENCOUNTER — Observation Stay (HOSPITAL_COMMUNITY)
Admission: EM | Admit: 2022-01-22 | Discharge: 2022-01-23 | Disposition: A | Discharge status: Home / Self Care | Payer: Self-pay | Attending: Family Medicine | Admitting: Family Medicine

## 2022-01-22 ENCOUNTER — Observation Stay (HOSPITAL_COMMUNITY): Payer: Self-pay

## 2022-01-22 DIAGNOSIS — R42 Dizziness and giddiness: Secondary | ICD-10-CM | POA: Insufficient documentation

## 2022-01-22 DIAGNOSIS — G459 Transient cerebral ischemic attack, unspecified: Secondary | ICD-10-CM | POA: Insufficient documentation

## 2022-01-22 DIAGNOSIS — I639 Cerebral infarction, unspecified: Secondary | ICD-10-CM

## 2022-01-22 DIAGNOSIS — I10 Essential (primary) hypertension: Secondary | ICD-10-CM | POA: Insufficient documentation

## 2022-01-22 DIAGNOSIS — Z20822 Contact with and (suspected) exposure to covid-19: Secondary | ICD-10-CM | POA: Insufficient documentation

## 2022-01-22 DIAGNOSIS — E785 Hyperlipidemia, unspecified: Secondary | ICD-10-CM | POA: Insufficient documentation

## 2022-01-22 DIAGNOSIS — E782 Mixed hyperlipidemia: Secondary | ICD-10-CM

## 2022-01-22 DIAGNOSIS — H532 Diplopia: Principal | ICD-10-CM | POA: Insufficient documentation

## 2022-01-22 DIAGNOSIS — Z79899 Other long term (current) drug therapy: Secondary | ICD-10-CM | POA: Insufficient documentation

## 2022-01-22 LAB — I-STAT CHEM 8, ED
BUN: 11 mg/dL (ref 6–20)
Calcium, Ion: 1.21 mmol/L (ref 1.15–1.40)
Chloride: 102 mmol/L (ref 98–111)
Creatinine, Ser: 0.8 mg/dL (ref 0.61–1.24)
Glucose, Bld: 136 mg/dL — ABNORMAL HIGH (ref 70–99)
HCT: 50 % (ref 39.0–52.0)
Hemoglobin: 17 g/dL (ref 13.0–17.0)
Potassium: 3.9 mmol/L (ref 3.5–5.1)
Sodium: 138 mmol/L (ref 135–145)
TCO2: 25 mmol/L (ref 22–32)

## 2022-01-22 LAB — URINALYSIS, ROUTINE W REFLEX MICROSCOPIC
Bacteria, UA: NONE SEEN
Bilirubin Urine: NEGATIVE
Glucose, UA: 50 mg/dL — AB
Hgb urine dipstick: NEGATIVE
Ketones, ur: NEGATIVE mg/dL
Nitrite: NEGATIVE
Protein, ur: NEGATIVE mg/dL
Specific Gravity, Urine: 1.023 (ref 1.005–1.030)
pH: 6 (ref 5.0–8.0)

## 2022-01-22 LAB — CBC
HCT: 48.2 % (ref 39.0–52.0)
Hemoglobin: 16.5 g/dL (ref 13.0–17.0)
MCH: 35.1 pg — ABNORMAL HIGH (ref 26.0–34.0)
MCHC: 34.2 g/dL (ref 30.0–36.0)
MCV: 102.6 fL — ABNORMAL HIGH (ref 80.0–100.0)
Platelets: 198 10*3/uL (ref 150–400)
RBC: 4.7 MIL/uL (ref 4.22–5.81)
RDW: 11.3 % — ABNORMAL LOW (ref 11.5–15.5)
WBC: 5.9 10*3/uL (ref 4.0–10.5)
nRBC: 0 % (ref 0.0–0.2)

## 2022-01-22 LAB — RESP PANEL BY RT-PCR (FLU A&B, COVID) ARPGX2
Influenza A by PCR: NEGATIVE
Influenza B by PCR: NEGATIVE
SARS Coronavirus 2 by RT PCR: NEGATIVE

## 2022-01-22 LAB — DIFFERENTIAL
Abs Immature Granulocytes: 0.01 10*3/uL (ref 0.00–0.07)
Basophils Absolute: 0 10*3/uL (ref 0.0–0.1)
Basophils Relative: 1 %
Eosinophils Absolute: 0.1 10*3/uL (ref 0.0–0.5)
Eosinophils Relative: 1 %
Immature Granulocytes: 0 %
Lymphocytes Relative: 31 %
Lymphs Abs: 1.9 10*3/uL (ref 0.7–4.0)
Monocytes Absolute: 0.6 10*3/uL (ref 0.1–1.0)
Monocytes Relative: 11 %
Neutro Abs: 3.3 10*3/uL (ref 1.7–7.7)
Neutrophils Relative %: 56 %

## 2022-01-22 LAB — COMPREHENSIVE METABOLIC PANEL
ALT: 25 U/L (ref 0–44)
AST: 30 U/L (ref 15–41)
Albumin: 3.6 g/dL (ref 3.5–5.0)
Alkaline Phosphatase: 61 U/L (ref 38–126)
Anion gap: 8 (ref 5–15)
BUN: 10 mg/dL (ref 6–20)
CO2: 24 mmol/L (ref 22–32)
Calcium: 9.1 mg/dL (ref 8.9–10.3)
Chloride: 104 mmol/L (ref 98–111)
Creatinine, Ser: 0.93 mg/dL (ref 0.61–1.24)
GFR, Estimated: >60 mL/min (ref >60)
Glucose, Bld: 140 mg/dL — ABNORMAL HIGH (ref 70–99)
Potassium: 3.9 mmol/L (ref 3.5–5.1)
Sodium: 136 mmol/L (ref 135–145)
Total Bilirubin: 1.5 mg/dL — ABNORMAL HIGH (ref 0.3–1.2)
Total Protein: 6.3 g/dL — ABNORMAL LOW (ref 6.5–8.1)

## 2022-01-22 LAB — ETHANOL: Alcohol, Ethyl (B): <10 mg/dL (ref <10)

## 2022-01-22 LAB — APTT: aPTT: 28 seconds (ref 24–36)

## 2022-01-22 LAB — RAPID URINE DRUG SCREEN, HOSP PERFORMED
Amphetamines: NOT DETECTED
Barbiturates: NOT DETECTED
Benzodiazepines: NOT DETECTED
Cocaine: NOT DETECTED
Opiates: NOT DETECTED
Tetrahydrocannabinol: NOT DETECTED

## 2022-01-22 LAB — PROTIME-INR
INR: 1 (ref 0.8–1.2)
Prothrombin Time: 12.9 seconds (ref 11.4–15.2)

## 2022-01-22 LAB — CBG MONITORING, ED: Glucose-Capillary: 127 mg/dL — ABNORMAL HIGH (ref 70–99)

## 2022-01-22 MED ORDER — IOHEXOL 350 MG/ML SOLN
100.0000 mL | Freq: Once | INTRAVENOUS | Status: AC | PRN
Start: 2022-01-22 — End: 2022-01-22
  Administered 2022-01-22: 100 mL via INTRAVENOUS

## 2022-01-22 MED ORDER — ASPIRIN EC 81 MG PO TBEC
81.0000 mg | DELAYED_RELEASE_TABLET | Freq: Every day | ORAL | Status: DC
  Administered 2022-01-23: 81 mg via ORAL
  Filled 2022-01-22: qty 1

## 2022-01-22 MED ORDER — ACETAMINOPHEN 325 MG PO TABS: 650.0000 mg | ORAL_TABLET | Freq: Four times a day (QID) | ORAL | Status: DC | PRN

## 2022-01-22 MED ORDER — ASPIRIN EC 325 MG PO TBEC
325.0000 mg | DELAYED_RELEASE_TABLET | Freq: Once | ORAL | Status: AC
  Administered 2022-01-22: 325 mg via ORAL
  Filled 2022-01-22: qty 1

## 2022-01-22 MED ORDER — ACETAMINOPHEN 650 MG RE SUPP: 650.0000 mg | Freq: Four times a day (QID) | RECTAL | Status: DC | PRN

## 2022-01-22 MED ORDER — STROKE: EARLY STAGES OF RECOVERY BOOK
Freq: Once | Status: DC
  Filled 2022-01-22: qty 1

## 2022-01-22 NOTE — ED Triage Notes (Signed)
Pt having dizziness and blurred vision to both eyes since 1300. Pt denies taking his prescribed BP meds. No numbness or weakness. Denies pain ?

## 2022-01-22 NOTE — Code Documentation (Signed)
Responded to Code Stroke called at 2100 on pt in triage, [id number]. Code Stroke called for R sided gaze, L hemianopia. CBG-127, NIH-2, CT head negative for acute changes. CTA/CTP-no LVO. TNK not given-outside window. Plan for MRI. ?

## 2022-01-22 NOTE — ED Provider Triage Note (Signed)
Emergency Medicine Provider Triage Evaluation Note ? ?Dennis Vega , a 61 y.o. male  was evaluated in triage.  Pt complains of dizziness and double vision since 13:30 this afternoon.  Patient reports he has hypertensive diagnosis however has been off blood pressure medication for quite some time due to inability to pay.  Patient unable to look to the left side in triage. ? ?Review of Systems  ?Positive:  ?Negative:  ? ?Physical Exam  ?BP (!) 147/88   Pulse 75   Temp 98.6 ?F (37 ?C) (Oral)   Resp 16   Ht 5\' 9"  (1.753 m)   Wt 81.6 kg   SpO2 97%   BMI 26.58 kg/m?  ?Gen:   Awake, no distress   ?Resp:  Normal effort  ?MSK:   Moves extremities without difficulty  ?Other:  Left-sided neglect ? ?Medical Decision Making  ?Medically screening exam initiated at 8:55 PM.  Appropriate orders placed.  Dennis Vega was informed that the remainder of the evaluation will be completed by another provider, this initial triage assessment does not replace that evaluation, and the importance of remaining in the ED until their evaluation is complete. ? ? ?  ?Susanne Borders, PA-C ?01/22/22 2056 ? ?

## 2022-01-22 NOTE — ED Provider Notes (Signed)
?[facility] ?Provider Note ? ? ?CSN: [id number] ?Arrival date & time: 01/22/22  2021 ? ?  ? ?History ? ?Chief Complaint  ?Patient presents with  ? Dizziness  ? ? ?Dennis Vega is a 61 y.o. male with a past medical history of hypertension who presents to the emergency department complaining of dizziness and double vision since 1:30 PM prior to arrival.  Patient notes he has not been compliant with his antihypertensives due to not being able to afford them.  He notes that his symptoms have resolved since being in triage.  Denies history of similar symptoms. ?Denies chest pain, shortness of breath, fever, chills, nausea, vomiting, back pain, neck pain.  Did not try any medications prior to arrival.   ? ?The history is provided by the patient. No language interpreter was used.  ? ?  ? ?Home Medications ?Prior to Admission medications   ?Medication Sig Start Date End Date Taking? Authorizing Provider  ?amLODipine (NORVASC) 5 MG tablet Take 1 tablet (5 mg total) by mouth daily. 04/12/20   Seawell, Jaimie A, DO  ?chlordiazePOXIDE (LIBRIUM) 25 MG capsule 50mg  PO TID x 1D, then 25-50mg  PO BID X 1D, then 25-50mg  PO QD X 1D 123456   Delora Fuel, MD  ?folic acid (FOLVITE) 1 MG tablet Take 1 tablet (1 mg total) by mouth daily. 04/12/20   Seawell, Jaimie A, DO  ?ibuprofen (ADVIL) 200 MG tablet Take 200 mg by mouth daily as needed for moderate pain.    [provider]  ?naltrexone (DEPADE) 50 MG tablet Take 1 tablet (50 mg total) by mouth daily. 04/11/20   Seawell, Jaimie A, DO  ?thiamine 100 MG tablet Take 1 tablet (100 mg total) by mouth daily. 04/12/20   Seawell, Jaimie A, DO  ?   ? ?Allergies    ?Patient has no known allergies.   ? ?Review of Systems   ?Review of Systems  ?Constitutional:  Negative for chills and fever.  ?Eyes:  Positive for visual disturbance.  ?Respiratory:  Negative for shortness of breath.   ?Cardiovascular:  Negative for chest pain.  ?Gastrointestinal:   Negative for nausea and vomiting.  ?Musculoskeletal:  Negative for back pain, gait problem and neck pain.  ?Neurological:  Positive for dizziness.  ?All other systems reviewed and are negative. ? ?Physical Exam ?Updated Vital Signs ?BP (!) 147/88   Pulse 69   Temp 98.6 ?F (37 ?C) (Oral)   Resp 13   Ht 5\' 9"  (1.753 m)   Wt 81.6 kg   SpO2 99%   BMI 26.58 kg/m?  ?Physical Exam ?Vitals and nursing note reviewed.  ?Constitutional:   ?   General: He is not in acute distress. ?   Appearance: He is not diaphoretic.  ?HENT:  ?   Head: Normocephalic and atraumatic.  ?   Mouth/Throat:  ?   Pharynx: No oropharyngeal exudate.  ?Eyes:  ?   General: No visual field deficit or scleral icterus. ?   Conjunctiva/sclera: Conjunctivae normal.  ?Cardiovascular:  ?   Rate and Rhythm: Normal rate and regular rhythm.  ?   Pulses: Normal pulses.  ?   Heart sounds: Normal heart sounds.  ?Pulmonary:  ?   Effort: Pulmonary effort is normal. No respiratory distress.  ?   Breath sounds: Normal breath sounds. No wheezing.  ?Abdominal:  ?   General: Bowel sounds are normal.  ?   Palpations: Abdomen is soft. There is no mass.  ?   Tenderness: There  is no abdominal tenderness. There is no guarding or rebound.  ?Musculoskeletal:     ?   General: Normal range of motion.  ?   Cervical back: Normal range of motion and neck supple.  ?   Comments: Strength and sensation intact to bilateral upper and lower extremities.  ?Skin: ?   General: Skin is warm and dry.  ?Neurological:  ?   Mental Status: He is alert.  ?   Cranial Nerves: No facial asymmetry.  ?   Sensory: Sensation is intact.  ?   Motor: No pronator drift.  ?   Comments: Slowed left sided EOM  ?Psychiatric:     ?   Behavior: Behavior normal.  ? ? ?ED Results / Procedures / Treatments   ?Labs ?(all labs ordered are listed, but only abnormal results are displayed) ?Labs Reviewed  ?CBC - Abnormal; Notable for the following components:  ?    Result Value  ? MCV 102.6 (*)   ? MCH 35.1 (*)   ? RDW  11.3 (*)   ? All other components within normal limits  ?COMPREHENSIVE METABOLIC PANEL - Abnormal; Notable for the following components:  ? Glucose, Bld 140 (*)   ? Total Protein 6.3 (*)   ? Total Bilirubin 1.5 (*)   ? All other components within normal limits  ?URINALYSIS, ROUTINE W REFLEX MICROSCOPIC - Abnormal; Notable for the following components:  ? Glucose, UA 50 (*)   ? Leukocytes,Ua TRACE (*)   ? All other components within normal limits  ?I-STAT CHEM 8, ED - Abnormal; Notable for the following components:  ? Glucose, Bld 136 (*)   ? All other components within normal limits  ?CBG MONITORING, ED - Abnormal; Notable for the following components:  ? Glucose-Capillary 127 (*)   ? All other components within normal limits  ?RESP PANEL BY RT-PCR (FLU A&B, COVID) [id number]  ?ETHANOL  ?PROTIME-INR  ?APTT  ?DIFFERENTIAL  ?RAPID URINE DRUG SCREEN, HOSP PERFORMED  ? ? ?EKG ?None ? ?Radiology ?CT HEAD CODE STROKE WO CONTRAST ? ?Result Date: 01/22/2022 ?CLINICAL DATA:  Code stroke.  Left-sided neglect EXAM: CT HEAD WITHOUT CONTRAST TECHNIQUE: Contiguous axial images were obtained from the base of the skull through the vertex without intravenous contrast. RADIATION DOSE REDUCTION: This exam was performed according to the departmental dose-optimization program which includes automated exposure control, adjustment of the mA and/or kV according to patient size and/or use of iterative reconstruction technique. COMPARISON:  None Available. FINDINGS: Brain: There is no mass, hemorrhage or extra-axial collection. There is generalized atrophy without lobar predilection. There is hypoattenuation of the periventricular white matter, most commonly indicating chronic ischemic microangiopathy. 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 Vascular: No abnormal hyperdensity of the major intracranial arteries or dural venous sinuses. No intracranial atherosclerosis. Skull: The visualized skull base, calvarium and extracranial  soft tissues are normal. Sinuses/Orbits: No fluid levels or advanced mucosal thickening of the visualized paranasal sinuses. No mastoid or middle ear effusion. The orbits are normal. ASPECTS [facility] Stroke Program Early CT Score) - Ganglionic level infarction (caudate, lentiform nuclei, internal capsule, insula, M1-M3 cortex): 7 - Supraganglionic infarction (M4-M6 cortex): 3 Total score (0-10 with 10 being normal): 10 IMPRESSION: 1. No acute intracranial abnormality. 2. ASPECTS is 10. 3. Chronic small vessel disease These results were called by telephone at the time of interpretation on 01/22/2022 at 9:12 pm to provider Kerney Elbe, who verbally acknowledged these results. Electronically Signed   By: Cletus Gash.D.  On: 01/22/2022 21:13   ? ?Procedures ?Procedures  ? ? ?Medications Ordered in ED ?Medications  ?iohexol (OMNIPAQUE) 350 MG/ML injection 100 mL (100 mLs Intravenous Contrast Given 01/22/22 2140)  ? ? ?ED Course/ Medical Decision Making/ A&P ?Clinical Course as of 01/22/22 2318  ?Fri Jan 22, 2022  ?2155 Discussion with neurologist, Dr. Cheral Marker who evaluated patient and noted that he would like the patient to be admitted to the hospital to rule out TIA or small stroke. [SB]  ?[place] with Hospitalist, Dr. Velia Meyer who agrees with admission at this time.  [SB]  ?  ?Clinical Course User Index ?[SB] Eisley Barber A, PA-C  ? ?                        ?Medical Decision Making ?Amount and/or Complexity of Data Reviewed ?Radiology: ordered. ? ?Risk ?Decision regarding hospitalization. ? ? ?Pt presents with dizziness and blurred vision onset 1:30 PM prior to arrival.  Has a past medical history of hypertension and is noncompliant with his medications due to not being able to afford his meds.  Vital signs stable, patient afebrile.  On exam patient with slowed left-sided extraocular movements.  Negative pronator drift.  Alert and oriented x4.  Strength and sensation intact to bilateral upper and lower  extremities.  No acute cardiovascular or respiratory exam findings.  Differential diagnosis includes TIA, CVA, SAH, ICH, anemia, electrolyte abnormality, arrhythmia. ? ?Labs:  ?I ordered, and personally interpreted labs.

## 2022-01-22 NOTE — Consult Note (Signed)
?                    NEURO HOSPITALIST CONSULT NOTE  ? ?Requestig physician: Dr. Roslynn Amble ? ?Reason for Consult: Acute onset of bilateral visual blurring and tunnel vision with inability to gaze to the left noted in Triage ? ?History obtained from:   Patient and Chart    ? ?HPI:                                                                                                                                         ? Dennis Vega is an 61 y.o. male with a PMHx of HTN, hand surgery and mandibular fracture, no history of stroke or MI, who  presents with dizziness in conjunction with tunnel vision and blurred vision to both eyes since 1300. Pt denies pain, numbness or weakness.  ? ?In CT he further clarifies that while walking he felt unsteadiness, requiring him to take "baby steps". He also describes "blurred vision" and loss of vision to the left side of his visual fields such that "I couldn't see the cars coming on my left when I was crossing the street." ? ?He states that he has not been taking his BP medication.  ? ?Past Medical History:  ?Diagnosis Date  ? Hypertension   ? Pneumonia   ? ? ?Past Surgical History:  ?Procedure Laterality Date  ? HAND SURGERY    ? Right  ? ORIF MANDIBULAR FRACTURE Bilateral 06/24/2019  ? Procedure: MMF MANDIBULAR FRACTURE;  Surgeon: Jodi Marble, MD;  Location: [facility];  Service: ENT;  Laterality: Bilateral;  ? ? ?Family History  ?Problem Relation Age of Onset  ? Stroke Mother   ?         ? ?Social History:  reports that he has never smoked. He has never used smokeless tobacco. He reports current alcohol use of about 10.0 standard drinks per week. He reports that he does not currently use drugs after having used the following drugs: "Crack" cocaine and Marijuana. ? ?No Known Allergies ? ?MEDICATIONS:                                                                                                                     ?No current facility-administered medications on file prior to  encounter.  ? ?Current Outpatient Medications on File Prior to Encounter  ?Medication Sig Dispense Refill  ?  amLODipine (NORVASC) 5 MG tablet Take 1 tablet (5 mg total) by mouth daily. 30 tablet 0  ? chlordiazePOXIDE (LIBRIUM) 25 MG capsule 50mg  PO TID x 1D, then 25-50mg  PO BID X 1D, then 25-50mg  PO QD X 1D 10 capsule 0  ? folic acid (FOLVITE) 1 MG tablet Take 1 tablet (1 mg total) by mouth daily. 30 tablet 0  ? ibuprofen (ADVIL) 200 MG tablet Take 200 mg by mouth daily as needed for moderate pain.    ? naltrexone (DEPADE) 50 MG tablet Take 1 tablet (50 mg total) by mouth daily. 30 tablet 0  ? thiamine 100 MG tablet Take 1 tablet (100 mg total) by mouth daily. 30 tablet 0  ? ? ? ? ?ROS:                                                                                                                                       ?As per HPI. Does not endorse any additional symptoms. Comprehensive ROS deferred due to acuity of presentation.  ? ? ?Blood pressure (!) 147/88, pulse 75, temperature 98.6 ?F (37 ?C), temperature source Oral, resp. rate 16, height 5\' 9"  (1.753 m), weight 81.6 kg, SpO2 97 %. ? ? ?General Examination:                                                                                                      ? ?Physical Exam  ?HEENT-  [place]/AT   ?Lungs- Respirations unlabored ?Extremities- No edema ? ?Neurological Examination ?Mental Status: Awake and alert. Oriented x 5. Speech fluent with intact naming and comprehension. No dysarthria.  ?Cranial Nerves: ?II: Visual fields intact OU. No extinction to DSS. PERRL.   ?III,IV, VI: No ptosis. EOM with hesitancy on leftward visual pursuits and saccades. No nystagmus.  ?V: Temp sensation equal bilaterally ?VII: Mild left facial droop ?VIII: Hearing intact to voice ?IX,X: No hypophonia or hoarseness ?XI: Head is midline ?XII: Slight leftward deviation of tongue ?Motor: ?RUE and RLE 5/5 ?LUE with mildly slowed movements, 5/5 strength proximally and distally. No pronator  drift. Negative rotating fingers test.  ?LLE 5/5  ?Sensory: Temp and light touch intact x 4. No extinction to DSS.  ?Deep Tendon Reflexes: 2+ and symmetric throughout ?Cerebellar: No ataxia with FNF bilaterally. H-S intact bilaterally  ?Gait: Deferred ? ?NIHSS: 2 ?  ?Lab Results: ?Basic Metabolic Panel: ?No results for input(s): NA, K, CL, CO2, GLUCOSE, BUN, CREATININE, CALCIUM, MG, PHOS in the last 168 hours. ? ?CBC: ?  Recent Labs  ?Lab 01/22/22 ?2100  ?WBC 5.9  ?NEUTROABS 3.3  ?HGB 16.5  ?HCT 48.2  ?MCV 102.6*  ?PLT 198  ? ? ?Cardiac Enzymes: ?No results for input(s): CKTOTAL, CKMB, CKMBINDEX, TROPONINI in the last 168 hours. ? ?Lipid Panel: ?No results for input(s): CHOL, TRIG, HDL, CHOLHDL, VLDL, LDLCALC in the last 168 hours. ? ?Imaging: ?No results found. ? ? ?Assessment: 61 y.o. male with a PMHx of HTN who  presents with dizziness and gait instability in conjunction with left sided visual field cut, with symptoms beginning at 1300.  ?1. Exam reveals left facial droop and hesitancy with conjugate gaze towards the left.  ?2. CT head: No acute intracranial abnormality. Chronic small vessel disease. ?3. CTA of head and neck: No LVO ?4. CTP: No perfusion deficit.  ?5. EKG: Sinus rhythm; Atrial premature complexes; Borderline prolonged QT interval ? ? ?Recommendations: ?1. HgbA1c, fasting lipid panel ?2. MRI of the brain without contrast ?3. PT consult, OT consult, Speech consult ?4. Echocardiogram ?5. Start atorvastatin 40 mg po qd. Obtain baseline CK level ?6. ASA 325 mg po x 1 now, then 81 mg po qAM  ?7. Risk factor modification.  ?8. Telemetry monitoring ?9. Frequent neuro checks ?10. NPO until passes stroke swallow screen ?11. CIWA protocol (drinks EtOH at home nightly) ?12. Permissive HTN x 24 hours.  ? ? ?Electronically signed: Dr. Kerney Elbe ?01/22/2022, 9:09 PM ? ? ? ?   ?

## 2022-01-23 ENCOUNTER — Encounter (HOSPITAL_COMMUNITY): Payer: Self-pay | Admitting: Internal Medicine

## 2022-01-23 ENCOUNTER — Observation Stay (HOSPITAL_BASED_OUTPATIENT_CLINIC_OR_DEPARTMENT_OTHER): Payer: Self-pay

## 2022-01-23 DIAGNOSIS — G459 Transient cerebral ischemic attack, unspecified: Secondary | ICD-10-CM

## 2022-01-23 DIAGNOSIS — E785 Hyperlipidemia, unspecified: Secondary | ICD-10-CM

## 2022-01-23 DIAGNOSIS — E782 Mixed hyperlipidemia: Secondary | ICD-10-CM

## 2022-01-23 LAB — CBC WITH DIFFERENTIAL/PLATELET
Abs Immature Granulocytes: 0.01 10*3/uL (ref 0.00–0.07)
Basophils Absolute: 0 10*3/uL (ref 0.0–0.1)
Basophils Relative: 1 %
Eosinophils Absolute: 0.1 10*3/uL (ref 0.0–0.5)
Eosinophils Relative: 2 %
HCT: 47.3 % (ref 39.0–52.0)
Hemoglobin: 16.3 g/dL (ref 13.0–17.0)
Immature Granulocytes: 0 %
Lymphocytes Relative: 39 %
Lymphs Abs: 2 10*3/uL (ref 0.7–4.0)
MCH: 35.2 pg — ABNORMAL HIGH (ref 26.0–34.0)
MCHC: 34.5 g/dL (ref 30.0–36.0)
MCV: 102.2 fL — ABNORMAL HIGH (ref 80.0–100.0)
Monocytes Absolute: 0.6 10*3/uL (ref 0.1–1.0)
Monocytes Relative: 11 %
Neutro Abs: 2.4 10*3/uL (ref 1.7–7.7)
Neutrophils Relative %: 47 %
Platelets: 186 10*3/uL (ref 150–400)
RBC: 4.63 MIL/uL (ref 4.22–5.81)
RDW: 11.4 % — ABNORMAL LOW (ref 11.5–15.5)
WBC: 5.1 10*3/uL (ref 4.0–10.5)
nRBC: 0 % (ref 0.0–0.2)

## 2022-01-23 LAB — COMPREHENSIVE METABOLIC PANEL
ALT: 22 U/L (ref 0–44)
AST: 23 U/L (ref 15–41)
Albumin: 3.2 g/dL — ABNORMAL LOW (ref 3.5–5.0)
Alkaline Phosphatase: 57 U/L (ref 38–126)
Anion gap: 7 (ref 5–15)
BUN: 9 mg/dL (ref 6–20)
CO2: 22 mmol/L (ref 22–32)
Calcium: 8.9 mg/dL (ref 8.9–10.3)
Chloride: 107 mmol/L (ref 98–111)
Creatinine, Ser: 0.89 mg/dL (ref 0.61–1.24)
GFR, Estimated: >60 mL/min (ref >60)
Glucose, Bld: 145 mg/dL — ABNORMAL HIGH (ref 70–99)
Potassium: 3.7 mmol/L (ref 3.5–5.1)
Sodium: 136 mmol/L (ref 135–145)
Total Bilirubin: 1.2 mg/dL (ref 0.3–1.2)
Total Protein: 5.8 g/dL — ABNORMAL LOW (ref 6.5–8.1)

## 2022-01-23 LAB — ECHOCARDIOGRAM COMPLETE
Area-P 1/2: 4.8 cm2
Height: 69 in
S' Lateral: 3.2 cm
Weight: 2880 oz

## 2022-01-23 LAB — LIPID PANEL
Cholesterol: 158 mg/dL (ref 0–200)
HDL: 46 mg/dL (ref >40)
LDL Cholesterol: 102 mg/dL — ABNORMAL HIGH (ref 0–99)
Total CHOL/HDL Ratio: 3.4 RATIO
Triglycerides: 51 mg/dL (ref <150)
VLDL: 10 mg/dL (ref 0–40)

## 2022-01-23 LAB — HEMOGLOBIN A1C
Hgb A1c MFr Bld: 5.5 % (ref 4.8–5.6)
Mean Plasma Glucose: 111.15 mg/dL

## 2022-01-23 LAB — MAGNESIUM: Magnesium: 2.1 mg/dL (ref 1.7–2.4)

## 2022-01-23 MED ORDER — ATORVASTATIN CALCIUM 40 MG PO TABS: 40.0000 mg | ORAL_TABLET | Freq: Every day | ORAL | 0 refills | Status: DC

## 2022-01-23 MED ORDER — ASPIRIN 81 MG PO TBEC: 81.0000 mg | DELAYED_RELEASE_TABLET | Freq: Every day | ORAL | 11 refills | Status: DC

## 2022-01-23 MED ORDER — AMLODIPINE BESYLATE 5 MG PO TABS
5.0000 mg | ORAL_TABLET | Freq: Every day | ORAL | Status: DC
  Administered 2022-01-23: 5 mg via ORAL
  Filled 2022-01-23: qty 1

## 2022-01-23 MED ORDER — AMLODIPINE BESYLATE 5 MG PO TABS: 5.0000 mg | ORAL_TABLET | Freq: Every day | ORAL | 0 refills | Status: DC

## 2022-01-23 MED ORDER — HYDRALAZINE HCL 20 MG/ML IJ SOLN: 10.0000 mg | INTRAMUSCULAR | Status: DC | PRN

## 2022-01-23 MED ORDER — ATORVASTATIN CALCIUM 40 MG PO TABS
40.0000 mg | ORAL_TABLET | Freq: Every day | ORAL | Status: DC
Start: 2022-01-23 — End: 2022-01-23
  Administered 2022-01-23: 40 mg via ORAL
  Filled 2022-01-23: qty 1

## 2022-01-23 NOTE — Evaluation (Addendum)
Occupational Therapy Evaluation and Discharge ?Patient Details ?Name: Dennis Vega ?MRN: [id number] ?DOB: Mar 24, 1961 ?Today's Date: 01/23/2022 ? ? ?History of Present Illness 61 yo male presenting to ED on 5/5 with diplopia. MRI negative for acute changes. PMH including R hand sx, HTN, and ORIF mandibular fx (2020).  ? ?Clinical Impression ?  ?PTA, pt was staying at his cousin's home ("I'm homeless. I stay there when he lets me") and was performing with ADLs and IADLs. Currently, pt performing ADLs and functional mobility at Mod I level. Pt reporting he hasn't taken his blood pressure medication in years because of financial difficulties. For functional performance, pt presenting at baseline. Reporting blurry vision only occurs when first waking and denies any diplopia. Recommend dc home once medically stable per physician. All acute OT needs met and will sign off. Thank you.   ? ?Recommendations for follow up therapy are one component of a multi-disciplinary discharge planning process, led by the attending physician.  Recommendations may be updated based on patient status, additional functional criteria and insurance authorization.  ? ?Follow Up Recommendations ? No OT follow up  ?  ?Assistance Recommended at Discharge PRN  ?Patient can return home with the following   ? ?  ?Functional Status Assessment ?    ?Equipment Recommendations ? None recommended by OT  ?  ?Recommendations for Other Services   ? ? ?  ?Precautions / Restrictions Precautions ?Precautions: None  ? ?  ? ?Mobility Bed Mobility ?Overal bed mobility: Modified Independent ?  ?  ?  ?  ?  ?  ?  ?  ? ?Transfers ?Overall transfer level: Modified independent ?  ?  ?  ?  ?  ?  ?  ?  ?  ?  ? ?  ?Balance Overall balance assessment: No apparent balance deficits (not formally assessed) ?  ?  ?  ?  ?  ?  ?  ?  ?  ?  ?  ?  ?  ?  ?  ?  ?  ?  ?   ? ?ADL either performed or assessed with clinical judgement  ? ?ADL Overall ADL's : Modified independent ?  ?  ?  ?  ?  ?   ?  ?  ?  ?  ?  ?  ?  ?  ?  ?  ?  ?  ?  ?General ADL Comments: Increased time and effort. Pt able to perform LB dressing and mobility in hallway. Set up for breakfast. Generalized weakness and decreased health literacy. Very pleasant and willing to participate.  ? ? ? ?Vision Baseline Vision/History: 0 No visual deficits ?Vision Assessment?: Yes ?Eye Alignment: Within Functional Limits ?Ocular Range of Motion: Within Functional Limits ?Alignment/Gaze Preference: Within Defined Limits ?Tracking/Visual Pursuits: Able to track stimulus in all quads without difficulty ?Convergence: Within functional limits  ?   ?Perception   ?  ?Praxis   ?  ? ?Pertinent Vitals/Pain Pain Assessment ?Pain Assessment: Faces ?Faces Pain Scale: Hurts little more ?Pain Location: neck ?Pain Descriptors / Indicators: Discomfort ?Pain Intervention(s): Monitored during session, Limited activity within patient's tolerance, Repositioned  ? ? ? ?Hand Dominance Right ?  ?Extremity/Trunk Assessment Upper Extremity Assessment ?Upper Extremity Assessment: Overall WFL for tasks assessed ?  ?Lower Extremity Assessment ?Lower Extremity Assessment: Generalized weakness ?  ?Cervical / Trunk Assessment ?Cervical / Trunk Assessment: Normal ?  ?Communication Communication ?Communication: No difficulties ?  ?Cognition Arousal/Alertness: Awake/alert ?Behavior During Therapy: [facility] for tasks assessed/performed, Flat affect ?Overall  Cognitive Status: Within Functional Limits for tasks assessed ?  ?  ?  ?  ?  ?  ?  ?  ?  ?  ?  ?  ?  ?  ?  ?  ?  ?  ?  ?General Comments  VSS on RA ? ?  ?Exercises   ?  ?Shoulder Instructions    ? ? ?Home Living Family/patient expects to be discharged to:: Private residence ?Living Arrangements: Alone ?Available Help at Discharge: Available PRN/intermittently ?Type of Home: House ?Home Access: Stairs to enter ?Entrance Stairs-Number of Steps: 4 ?  ?Home Layout: One level ?  ?  ?Bathroom Shower/Tub: Tub/shower unit ?  ?Bathroom Toilet:  Standard ?  ?  ?Home Equipment: None ?  ?Additional Comments: Homeless - "been staying at my cousin's home. I just sleep there but I dont live there." ?  ? ?  ?Prior Functioning/Environment Prior Level of Function : Independent/Modified Independent ?  ?  ?  ?  ?  ?  ?  ?ADLs Comments: Performs ADLs. Reports he hasnt been able to get his medications due to finacial difficulties. ?  ? ?  ?  ?OT Problem List: Impaired vision/perception;Decreased activity tolerance ?  ?   ?OT Treatment/Interventions:    ?  ?OT Goals(Current goals can be found in the care plan section) Acute Rehab OT Goals ?Patient Stated Goal: Get stronger ?OT Goal Formulation: All assessment and education complete, DC therapy  ?OT Frequency:   ?  ? ?Co-evaluation   ?  ?  ?  ?  ? ?  ?AM-PAC OT "6 Clicks" Daily Activity     ?Outcome Measure Help from another person eating meals?: None ?Help from another person taking care of personal grooming?: None ?Help from another person toileting, which includes using toliet, bedpan, or urinal?: None ?Help from another person bathing (including washing, rinsing, drying)?: None ?Help from another person to put on and taking off regular upper body clothing?: None ?Help from another person to put on and taking off regular lower body clothing?: None ?6 Click Score: 24 ?  ?End of Session Equipment Utilized During Treatment: Gait belt ?Nurse Communication: Mobility status ? ?Activity Tolerance: Patient tolerated treatment well ?Patient left: in chair;with call bell/phone within reach ? ?OT Visit Diagnosis: Other abnormalities of gait and mobility (R26.89)  ?              ?Time: 4665-9935 ?OT Time Calculation (min): 13 min ?Charges:  OT General Charges ?$OT Visit: 1 Visit ?OT Evaluation ?$OT Eval Low Complexity: 1 Low ? ?Tayari Yankee MSOT, OTR/L ?Acute Rehab ?Pager: [phone] ?Office: [phone] ? ?Yasmine Kilbourne M Melbert Botelho ?01/23/2022, 8:18 AM ?

## 2022-01-23 NOTE — Discharge Summary (Signed)
PatientPhysician Discharge Summary  ?Dennis Vega [id number] DOB: 1961-03-30 DOA: 01/22/2022 ? ?PCP: Patient, No Pcp Per (Inactive) ? ?Admit date: 01/22/2022 ?Discharge date: 01/23/2022 ? ? ? ?Admitted From: Home ?Disposition: Home ? ?Recommendations for Outpatient Follow-up:  ?Follow up with PCP in 1-2 weeks ?Please obtain BMP/CBC in one week ?Please follow up with your PCP on the following pending results: ?Unresulted Labs (From admission, onward)  ? ?  Start     Ordered  ? 01/22/22 2257  Magnesium  Add-on,   AD       ? 01/22/22 2256  ? ?  ?  ? ?  ?  ? ? ?Home Health: None ?Equipment/Devices: None ? ?Discharge Condition: Stable ?CODE STATUS: Full code ?Diet recommendation: Cardiac ? ?Subjective: Seen and examined.  He states that his symptoms are resolved.  He does not have diplopia or any problem with vision or any other focal weakness.  He feels ready to go home. ? ?Brief/Interim Summary: Dennis Vega is a 61 year old male with past medical history of essential hypertension who was not taking any of his medications who presented to the ED with a complaint of double vision.  He was admitted under hospital service.  Seen by neurology.  Ended up having full work-up for the stroke which was ruled out.  Patient symptoms have resolved.  Echo was done but results are pending.  He was assessed by PT OT and he is considered to be at his baseline and independent so they did not recommend any outpatient PT OT.  Neurology has also cleared him with instructions to discharge on low-dose aspirin, atorvastatin 40 mg and amlodipine 5 mg p.o. daily due to elevated blood pressure.  Patient is aware of the plan and verbalized understanding.  Patient likely had TIA. ? ?Discharge plan was discussed with patient and/or family member and they verbalized understanding and agreed with it.  ?Discharge Diagnoses:  ?Principal Problem: ?  Diplopia ?Active Problems: ?  Hypertension ?  TIA (transient ischemic attack) ?   Hyperlipidemia ? ? ? ?Discharge Instructions ? ? ?Allergies as of 01/23/2022   ?No Known Allergies ?  ? ?  ?Medication List  ?  ? ?STOP taking these medications   ? ?chlordiazePOXIDE 25 MG capsule ?Commonly known as: LIBRIUM ?  ?folic acid 1 MG tablet ?Commonly known as: FOLVITE ?  ?ibuprofen 200 MG tablet ?Commonly known as: ADVIL ?  ?naltrexone 50 MG tablet ?Commonly known as: DEPADE ?  ?thiamine 100 MG tablet ?  ? ?  ? ?TAKE these medications   ? ?amLODipine 5 MG tablet ?Commonly known as: NORVASC ?Take 1 tablet (5 mg total) by mouth daily. ?  ?aspirin 81 MG EC tablet ?Take 1 tablet (81 mg total) by mouth daily. Swallow whole. ?Start taking on: Jan 24, 2022 ?  ?atorvastatin 40 MG tablet ?Commonly known as: LIPITOR ?Take 1 tablet (40 mg total) by mouth daily. ?  ? ?  ? ? Follow-up Information   ? ? [facility] Follow up.   ?Why: Please call and make an appointment monday to establish a priamry care doctor. They will work with you on finacials and have a pharmacy ?Contact information: ?[address] ?[place] [phone] ?[phone] ? ?  ?  ? ?  ?  ? ?  ? ?No Known Allergies ? ?Consultations: Neurology ? ? ?Procedures/Studies: ?MR BRAIN WO CONTRAST ? ?Result Date: 01/23/2022 ?CLINICAL DATA:  Transient ischemic attack EXAM: MRI HEAD WITHOUT  CONTRAST TECHNIQUE: Multiplanar, multiecho pulse sequences of the brain and surrounding structures were obtained without intravenous contrast. COMPARISON:  None Available. FINDINGS: Brain: No acute infarct, mass effect or extra-axial collection. No acute or chronic hemorrhage. There is multifocal hyperintense T2-weighted signal within the white matter. Parenchymal volume and CSF spaces are normal. The midline structures are normal. Vascular: Major flow voids are preserved. Skull and upper cervical spine: Normal calvarium and skull base. Visualized upper cervical spine and soft tissues are normal. Sinuses/Orbits:Right  maxillary sinus mucosal thickening. Normal orbits. IMPRESSION: 1. No acute intracranial abnormality. 2. Findings of chronic small vessel ischemia. Electronically Signed   By: Ulyses Jarred M.D.   On: 01/23/2022 00:54  ? ?CT HEAD CODE STROKE WO CONTRAST ? ?Result Date: 01/22/2022 ?CLINICAL DATA:  Code stroke.  Left-sided neglect EXAM: CT HEAD WITHOUT CONTRAST TECHNIQUE: Contiguous axial images were obtained from the base of the skull through the vertex without intravenous contrast. RADIATION DOSE REDUCTION: This exam was performed according to the departmental dose-optimization program which includes automated exposure control, adjustment of the mA and/or kV according to patient size and/or use of iterative reconstruction technique. COMPARISON:  None Available. FINDINGS: Brain: There is no mass, hemorrhage or extra-axial collection. There is generalized atrophy without lobar predilection. There is hypoattenuation of the periventricular white matter, most commonly indicating chronic ischemic microangiopathy. 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 Vascular: No abnormal hyperdensity of the major intracranial arteries or dural venous sinuses. No intracranial atherosclerosis. Skull: The visualized skull base, calvarium and extracranial soft tissues are normal. Sinuses/Orbits: No fluid levels or advanced mucosal thickening of the visualized paranasal sinuses. No mastoid or middle ear effusion. The orbits are normal. ASPECTS [facility] Stroke Program Early CT Score) - Ganglionic level infarction (caudate, lentiform nuclei, internal capsule, insula, M1-M3 cortex): 7 - Supraganglionic infarction (M4-M6 cortex): 3 Total score (0-10 with 10 being normal): 10 IMPRESSION: 1. No acute intracranial abnormality. 2. ASPECTS is 10. 3. Chronic small vessel disease These results were called by telephone at the time of interpretation on 01/22/2022 at 9:12 pm to provider Kerney Elbe, who verbally acknowledged these results.  Electronically Signed   By: Ulyses Jarred M.D.   On: 01/22/2022 21:13  ? ?CT ANGIO HEAD NECK W WO CM W PERF (CODE STROKE) ? ?Result Date: 01/22/2022 ?EXAM: CT ANGIOGRAPHY HEAD AND NECK CT PERFUSION BRAIN TECHNIQUE: Multidetector CT imaging of the head and neck was performed using the standard protocol during bolus administration of intravenous contrast. Multiplanar CT image reconstructions and MIPs were obtained to evaluate the vascular anatomy. Carotid stenosis measurements (when applicable) are obtained utilizing NASCET criteria, using the distal internal carotid diameter as the denominator. Multiphase CT imaging of the brain was performed following IV bolus contrast injection. Subsequent parametric perfusion maps were calculated using RAPID software. RADIATION DOSE REDUCTION: This exam was performed according to the departmental dose-optimization program which includes automated exposure control, adjustment of the mA and/or kV according to patient size and/or use of iterative reconstruction technique. CONTRAST:  17mL OMNIPAQUE IOHEXOL 350 MG/ML SOLN COMPARISON:  None Available. FINDINGS: CT HEAD FINDINGS Brain: There is no mass, hemorrhage or extra-axial collection. The size and configuration of the ventricles and extra-axial CSF spaces are normal. There is no acute or chronic infarction. The brain parenchyma is normal. Skull: The visualized skull base, calvarium and extracranial soft tissues are normal. Sinuses/Orbits: No fluid levels or advanced mucosal thickening of the visualized paranasal sinuses. No mastoid or middle ear effusion. The orbits are normal.  CTA NECK FINDINGS SKELETON: There is no bony spinal canal stenosis. No lytic or blastic lesion. OTHER NECK: Normal pharynx, larynx and major salivary glands. No cervical lymphadenopathy. Unremarkable thyroid gland. UPPER CHEST: No pneumothorax or pleural effusion. No nodules or masses. AORTIC ARCH: There is no calcific atherosclerosis of the aortic arch.  There is no aneurysm, dissection or hemodynamically significant stenosis of the visualized portion of the aorta. Conventional 3 vessel aortic branching pattern. The visualized proximal subclavian arteries are widely patent. RIGHT

## 2022-01-23 NOTE — Assessment & Plan Note (Signed)
? ?#)   Acute diplopia/dizziness: Acute onset of the symptoms, without overt vertigo, starting at 1330 on 01/22/2022, with ensuing complete resolution, without any additional subjective or objective evidence of acute focal neurologic deficits, with CT head showing no evidence of acute intracranial process, including no evidence of acute intracranial hemorrhage. Additionally, CTA head and neck with IV contrast showed no evidence of large vessel occlusion, high grade stenosis, dissection, aneurysm, or thrombus.   ? ?EDP discussed patient's case and imaging with the on-call neurologist, Dr. Otelia Limes, who recommended admission to the hospital service for further evaluation management of concern for TIA versus acute ischemic CVA, he including pursuit of MRI brain.  Neurology to formally consult, with additional recommendations to follow. ? ?MRI brain ordered, with result currently pending.  Result of such to delineate whether or not to pursue echocardiogram.  Regardless, we will proceed with evaluation of potential modifiable acute ischemic CVA risk factors, as described below. Will also monitor closely on telemetry to evaluate for the presence of previously undiagnosed atrial fibrillation.  ? ?Of note, the patient reportedly possesses modifiable CVA risk factor, in the form of hypertension, for which she is unintentionally noncompliant with his outpatient antihypertensive medications in the setting of affordability obstacles, as further detailed above.   ? ?Not a tPA candidate given complete resolution of his potential initial acute focal neurologic deficits. ?current outpatient antiplatelet/anticoagulant regimen: None. Will follow for results of MRI brain as well as ensuing formal neurology recommendations regarding plan for antiplatelet intervention.  Of note, the patient received full dose aspirin x1 in the ED today. Current outpatient anti-lipid regimen: None. ? ?Will allow for permissive hypertension for 24-48 hours  following onset of acute focal neurologic deficits, which were noted to occur at 1330 on 01/22/2022, with associated parameters further quantified below. ? ? ? ?Plan: Nursing bedside swallow evaluation x 1 now, and will not initiate oral medications or diet until the patient has passed this. Head of the bed at 30 degrees. Neuro checks per protocol. VS per protocol. Will allow for permissive hypertension for 24-48 hours following onset of acute focal neurologic deficits, during which will hold home antihypertensive medications, with prn IV hydralazine ordered for systolic blood pressure greater than 220 mmHg or diastolic blood pressure greater than 110 mmHg.  Monitor on telemetry, including monitoring for atrial fibrillation as modifiable risk factor for acute ischemic CVA.   MRI brain.  Check lipid panel and A1c. PT/OT consults have been ordered to occur in the morning.  Neurology consulted with additional recommendations pending at this time. ? ? ?

## 2022-01-23 NOTE — Progress Notes (Addendum)
STROKE TEAM PROGRESS NOTE  ? ?INTERVAL HISTORY ?He was leaving his caseworkers office and he was having dizziness, head spinning, and double vision. He had a couple episodes of this prior to coming to the ED. TOC consulted by primary team for medication and PCP needs. Amlodipine reordered and atorvastatin started.  ? ?Vitals:  ? 01/23/22 0215 01/23/22 0300 01/23/22 0415 01/23/22 0736  ?BP: (!) 148/74 (!) 173/97 (!) 161/89   ?Pulse: 76 (!) 58 61   ?Resp: (!) 22 (!) 21 17   ?Temp:      ?TempSrc:      ?SpO2: 97% 98% 96% 95%  ?Weight:      ?Height:      ? ?CBC:  ?Recent Labs  ?Lab 01/22/22 ?2100 01/22/22 ?2107 01/23/22 ?0303  ?WBC 5.9  --  5.1  ?NEUTROABS 3.3  --  2.4  ?HGB 16.5 17.0 16.3  ?HCT 48.2 50.0 47.3  ?MCV 102.6*  --  102.2*  ?PLT 198  --  186  ? ?Basic Metabolic Panel:  ?Recent Labs  ?Lab 01/22/22 ?2100 01/22/22 ?2107 01/23/22 ?0303  ?NA 136 138 136  ?K 3.9 3.9 3.7  ?CL 104 102 107  ?CO2 24  --  22  ?GLUCOSE 140* 136* 145*  ?BUN 10 11 9   ?CREATININE 0.93 0.80 0.89  ?CALCIUM 9.1  --  8.9  ?MG  --   --  2.1  ? ?Lipid Panel:  ?Recent Labs  ?Lab 01/23/22 ?0302  ?CHOL 158  ?TRIG 51  ?HDL 46  ?CHOLHDL 3.4  ?VLDL 10  ?[place] 102*  ? ?HgbA1c:  ?Recent Labs  ?Lab 01/23/22 ?0303  ?HGBA1C 5.5  ? ?Urine Drug Screen:  ?Recent Labs  ?Lab 01/22/22 ?2059  ?LABOPIA NONE DETECTED  ?COCAINSCRNUR NONE DETECTED  ?LABBENZ NONE DETECTED  ?AMPHETMU NONE DETECTED  ?THCU NONE DETECTED  ?LABBARB NONE DETECTED  ?  ?Alcohol Level  ?Recent Labs  ?Lab 01/22/22 ?2100  ?ETH <10  ? ? ?IMAGING past 24 hours ?MR BRAIN WO CONTRAST ? ?Result Date: 01/23/2022 ?CLINICAL DATA:  Transient ischemic attack EXAM: MRI HEAD WITHOUT CONTRAST TECHNIQUE: Multiplanar, multiecho pulse sequences of the brain and surrounding structures were obtained without intravenous contrast. COMPARISON:  None Available. FINDINGS: Brain: No acute infarct, mass effect or extra-axial collection. No acute or chronic hemorrhage. There is multifocal hyperintense T2-weighted signal  within the white matter. Parenchymal volume and CSF spaces are normal. The midline structures are normal. Vascular: Major flow voids are preserved. Skull and upper cervical spine: Normal calvarium and skull base. Visualized upper cervical spine and soft tissues are normal. Sinuses/Orbits:Right maxillary sinus mucosal thickening. Normal orbits. IMPRESSION: 1. No acute intracranial abnormality. 2. Findings of chronic small vessel ischemia. Electronically Signed   By: Ulyses Jarred M.D.   On: 01/23/2022 00:54  ? ?CT HEAD CODE STROKE WO CONTRAST ? ?Result Date: 01/22/2022 ?CLINICAL DATA:  Code stroke.  Left-sided neglect EXAM: CT HEAD WITHOUT CONTRAST TECHNIQUE: Contiguous axial images were obtained from the base of the skull through the vertex without intravenous contrast. RADIATION DOSE REDUCTION: This exam was performed according to the departmental dose-optimization program which includes automated exposure control, adjustment of the mA and/or kV according to patient size and/or use of iterative reconstruction technique. COMPARISON:  None Available. FINDINGS: Brain: There is no mass, hemorrhage or extra-axial collection. There is generalized atrophy without lobar predilection. There is hypoattenuation of the periventricular white matter, most commonly indicating chronic ischemic microangiopathy. 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 Vascular: No  abnormal hyperdensity of the major intracranial arteries or dural venous sinuses. No intracranial atherosclerosis. Skull: The visualized skull base, calvarium and extracranial soft tissues are normal. Sinuses/Orbits: No fluid levels or advanced mucosal thickening of the visualized paranasal sinuses. No mastoid or middle ear effusion. The orbits are normal. ASPECTS [facility] Stroke Program Early CT Score) - Ganglionic level infarction (caudate, lentiform nuclei, internal capsule, insula, M1-M3 cortex): 7 - Supraganglionic infarction (M4-M6 cortex): 3 Total  score (0-10 with 10 being normal): 10 IMPRESSION: 1. No acute intracranial abnormality. 2. ASPECTS is 10. 3. Chronic small vessel disease These results were called by telephone at the time of interpretation on 01/22/2022 at 9:12 pm to provider Kerney Elbe, who verbally acknowledged these results. Electronically Signed   By: Ulyses Jarred M.D.   On: 01/22/2022 21:13  ? ?CT ANGIO HEAD NECK W WO CM W PERF (CODE STROKE) ? ?Result Date: 01/22/2022 ?EXAM: CT ANGIOGRAPHY HEAD AND NECK CT PERFUSION BRAIN TECHNIQUE: Multidetector CT imaging of the head and neck was performed using the standard protocol during bolus administration of intravenous contrast. Multiplanar CT image reconstructions and MIPs were obtained to evaluate the vascular anatomy. Carotid stenosis measurements (when applicable) are obtained utilizing NASCET criteria, using the distal internal carotid diameter as the denominator. Multiphase CT imaging of the brain was performed following IV bolus contrast injection. Subsequent parametric perfusion maps were calculated using RAPID software. RADIATION DOSE REDUCTION: This exam was performed according to the departmental dose-optimization program which includes automated exposure control, adjustment of the mA and/or kV according to patient size and/or use of iterative reconstruction technique. CONTRAST:  115mL OMNIPAQUE IOHEXOL 350 MG/ML SOLN COMPARISON:  None Available. FINDINGS: CT HEAD FINDINGS Brain: There is no mass, hemorrhage or extra-axial collection. The size and configuration of the ventricles and extra-axial CSF spaces are normal. There is no acute or chronic infarction. The brain parenchyma is normal. Skull: The visualized skull base, calvarium and extracranial soft tissues are normal. Sinuses/Orbits: No fluid levels or advanced mucosal thickening of the visualized paranasal sinuses. No mastoid or middle ear effusion. The orbits are normal. CTA NECK FINDINGS SKELETON: There is no bony spinal canal  stenosis. No lytic or blastic lesion. OTHER NECK: Normal pharynx, larynx and major salivary glands. No cervical lymphadenopathy. Unremarkable thyroid gland. UPPER CHEST: No pneumothorax or pleural effusion. No nodules or masses. AORTIC ARCH: There is no calcific atherosclerosis of the aortic arch. There is no aneurysm, dissection or hemodynamically significant stenosis of the visualized portion of the aorta. Conventional 3 vessel aortic branching pattern. The visualized proximal subclavian arteries are widely patent. RIGHT CAROTID SYSTEM: Normal without aneurysm, dissection or stenosis. LEFT CAROTID SYSTEM: Normal without aneurysm, dissection or stenosis. VERTEBRAL ARTERIES: Left dominant configuration. Both origins are clearly patent. There is no dissection, occlusion or flow-limiting stenosis to the skull base (V1-V3 segments). CTA HEAD FINDINGS POSTERIOR CIRCULATION: --Vertebral arteries: Normal V4 segments. --Inferior cerebellar arteries: Normal. --Basilar artery: Normal. --Superior cerebellar arteries: Normal. --Posterior cerebral arteries (PCA): Normal. ANTERIOR CIRCULATION: --Intracranial internal carotid arteries: Normal. --Anterior cerebral arteries (ACA): Normal. Both A1 segments are present. Patent anterior communicating artery (a-comm). --Middle cerebral arteries (MCA): Normal. VENOUS SINUSES: As permitted by contrast timing, patent. ANATOMIC VARIANTS: None Review of the MIP images confirms the above findings. CT Brain Perfusion Findings: ASPECTS: 10 CBF (<30%) Volume: 9mL Perfusion (Tmax>6.0s) volume: 38mL Mismatch Volume: 43mL Infarction Location:None IMPRESSION: 1. Normal perfusion scan of the brain. 2. Normal CTA of the head and neck. Electronically Signed   By: Ulyses Jarred  M.D.   On: 01/22/2022 22:04   ? ?PHYSICAL EXAM ? ?Physical Exam  ?Constitutional: Appears well-developed and well-nourished.  ?Cardiovascular: Normal rate and regular rhythm.  ?Respiratory: Effort normal, non-labored  breathing ? ?Neuro: ?Mental Status: ?Patient is awake, alert, oriented to person, place, month, year, and situation. ?Patient is able to give a clear and coherent history. ?No signs of aphasia or neglect ?Cranial Nerves: ?

## 2022-01-23 NOTE — ED Notes (Signed)
Received verbal report from Connor L RN at this time 

## 2022-01-23 NOTE — Care Management (Addendum)
MATCH done for patient to obtain his medications It is imperative that the patient makes an appointment with [facility] to follow up and establish a PCP to continue his medications. Patient having ECHO done. MATCH letter given to patient and discussed with RN ? ?MATCH Medication Assistance Card ?Name:  Dennis Vega ?ID: [id number] ?Bin: [id number] RX Group: [id number] ?Discharge Date:  01/23/2022 ?Expiration Date:  01/30/2022 ?(must be filled within 7 days of discharge). ? ? ? ?  ?

## 2022-01-23 NOTE — Assessment & Plan Note (Signed)
? ?#)   Essential Hypertension: documented h/o such, with outpatient antihypertensive regimen including amlodipine.  Suboptimal outpatient compliance with his outpatient hypertensive medications due to difficulty affording these medications.  Will consult transition of care team for assistance in exploring options for procuring these medications as an outpatient, particularly given the patient's verbalization of desire for improvement in compliance that has been limited by these affordability obstacles.  SBP's in the ED today: 140s to 160s mmHg.  ? ?Plan: Close monitoring of subsequent BP via routine VS. hold home amlodipine for now, and observance of permissive hypertension, as further detailed above.  Consult for transition of care team place, as further detailed above. ? ? ?

## 2022-01-23 NOTE — Progress Notes (Signed)
PT Cancellation Note ? ?Patient Details ?Name: Dennis Vega ?MRN: [id number] ?DOB: 1961/03/23 ? ? ?Cancelled Treatment:    Reason Eval/Treat Not Completed: (P) OT screened, no PT needs identified, will sign off ? ?Winta Barcelo B. Beverely Risen PT, DPT ?Acute Rehabilitation Services ?Please use secure chat or  ?Call Office [phone] ? ?[name] ?01/23/2022, 8:32 AM ? ? ?

## 2022-01-23 NOTE — H&P (Signed)
?History and Physical  ? ? ?PLEASE NOTE THAT DRAGON DICTATION SOFTWARE WAS USED IN THE CONSTRUCTION OF THIS NOTE. ? ? ?Dennis Vega MVH:[id number] DOB: 09/14/1961 DOA: 01/22/2022 ? ?PCP: Patient, No Pcp Per (Inactive)  ?Patient coming from: home  ? ?I have personally briefly reviewed patient's [facility] ? ?Chief Complaint: Double vision ? ?HPI: Dennis Vega is a 61 y.o. male with medical history significant for essential hypertension who is [facility] 01/22/2022 for further stroke versus TIA evaluation after presenting from home to [facility] ED complaining of acute diplopia.  ? ?The patient reports sudden onset of diplopia as well as dizziness, in the absence of sensation of the room spinning, starting at 1330 on 01/22/2022.  These symptoms remain constant for approximately 2 hours, before subsequently improving after presenting to [facility] emergency department.  He notes that the symptoms have now completely resolved, and denies any residual or additional acute focal neurologic deficits.  ? ?Denies any associated acute focal weakness, paresthesias, numbness, dysphagia,, vertigo, nausea, vomiting, word finding difficulties, slurring of speech, facial droop, or headache.   ? ?Denies any associated chest pain, shortness of breath, palpitations, diaphoresis, presyncope, or syncope. ? ?Denies any previous history of stroke. Medical/social history notable for essential hypertension for which the patient reports suboptimal compliance with his outpatient antihypertensive regimen that includes amlodipine.  Specifically, he notes difficulty in affording his outpatient hypertensive medications, and conveys that he would be amenable to what ever kind of assistance may be available to him to help procure his outpatient antihypertensive medications, stating that he would otherwise be compliant in taking his medications if he was able to afford them.  ? ?Otherwise, denies any known  history of hyperlipidemia, diabetes, paroxysmal atrial fibrillation, obstructive sleep apnea, and reports that he is a lifelong non-smoker.  On no antiplatelet or anticoagulation medications at the time of the stroke, including no aspirin. Not currently taking a statin. ? ?Denies any recent subjective fever, chills, rigors, or generalized myalgias.  Denies any recent dysuria or gross hematuria, or change in urinary urgency/frequency. ? ? ? ? ?ED Course:  ?Vital signs in the ED were notable for the following: Afebrile; heart rate 61-81; blood pressure 147/88; respiratory rate 16-21, oxygen saturation 97 to 99% room air. ? ?Labs were notable for the following: CMP notable for the following: Sodium 136, bicarbonate 24, creatinine 0.93, glucose 140.  CBC notable for white cell count 5900.  INR 1.0.  Urinary drug screen pan negative.  Serum ethanol level less than 10.  COVID-19/influenza PCR negative. ? ?Imaging and additional notable ED work-up: EKG shows sinus rhythm with PACs, heart rate 71, no evidence of T wave or ST changes, including no evidence of ST elevation.  Noncontrast CT head shows no evidence of acute intracranial process, including no evidence of intracranial hemorrhage nor any evidence of acute infarct.  CTA head and neck showed no evidence of large vessel occlusion nor any evidence of aneurysm, dissection, or thrombus.   ? ?EDP discussed patient's case and imaging with the on-call neurologist, Dr. Otelia LimesLindzen, who recommended admission to the hospital service for further evaluation management of concern for TIA versus acute ischemic CVA, he including pursuit of MRI brain.  Neurology to formally consult, with additional recommendations to follow. ? ?MRI brain ordered, with result currently pending. ? ?While in the ED, the following were administered: Full dose aspirin x1. ? ?Subsequently, the patient was admitted for overnight observation for further evaluation and  management of his presenting acute diplopia  associated with dizziness, including further evaluation for acute ischemic CVA versus TIA.  ? ? ? ? ?Review of Systems: As per HPI otherwise 10 point review of systems negative.  ? ?Past Medical History:  ?Diagnosis Date  ? Hypertension   ? Pneumonia   ? ? ?Past Surgical History:  ?Procedure Laterality Date  ? HAND SURGERY    ? Right  ? ORIF MANDIBULAR FRACTURE Bilateral 06/24/2019  ? Procedure: MMF MANDIBULAR FRACTURE;  Surgeon: Flo Shanks, MD;  Location: [facility] OR;  Service: ENT;  Laterality: Bilateral;  ? ? ?Social History: ? reports that he has never smoked. He has never used smokeless tobacco. He reports current alcohol use. He reports that he does not currently use drugs after having used the following drugs: "Crack" cocaine and Marijuana. ? ? ?No Known Allergies ? ?Family History  ?Problem Relation Age of Onset  ? Stroke Mother   ? ? ?Family history reviewed and not pertinent  ? ? ?Prior to Admission medications   ?Medication Sig Start Date End Date Taking? Authorizing Provider  ?amLODipine (NORVASC) 5 MG tablet Take 1 tablet (5 mg total) by mouth daily. ?Patient not taking: Reported on 01/23/2022 04/12/20   Guinevere Scarlet A, DO  ?chlordiazePOXIDE (LIBRIUM) 25 MG capsule 50mg  PO TID x 1D, then 25-50mg  PO BID X 1D, then 25-50mg  PO QD X 1D ?Patient not taking: Reported on 01/23/2022 04/06/20   04/08/20, MD  ?folic acid (FOLVITE) 1 MG tablet Take 1 tablet (1 mg total) by mouth daily. ?Patient not taking: Reported on 01/23/2022 04/12/20   04/14/20 A, DO  ?ibuprofen (ADVIL) 200 MG tablet Take 200 mg by mouth daily as needed for moderate pain. ?Patient not taking: Reported on 01/23/2022    [provider]  ?naltrexone (DEPADE) 50 MG tablet Take 1 tablet (50 mg total) by mouth daily. ?Patient not taking: Reported on 01/23/2022 04/11/20   04/13/20 A, DO  ?thiamine 100 MG tablet Take 1 tablet (100 mg total) by mouth daily. ?Patient not taking: Reported on 01/23/2022 04/12/20   04/14/20 A, DO   ? ? ? ?Objective  ? ? ?Physical Exam: ?Vitals:  ? 01/22/22 2330 01/22/22 2345 01/23/22 0115 01/23/22 0215  ?BP: (!) 128/114 (!) 151/87  (!) 148/74  ?Pulse: 71 60 62 76  ?Resp: (!) 21 (!) 23 (!) 23 (!) 22  ?Temp:      ?TempSrc:      ?SpO2: 96% 97% 96% 97%  ?Weight:      ?Height:      ? ? ?General: appears to be stated age; alert, oriented ?Skin: warm, dry, no rash ?Head:  AT/[place] ?Mouth:  Oral mucosa membranes appear moist, normal dentition ?Neck: supple; trachea midline ?Heart:  RRR; did not appreciate any M/R/G ?Lungs: CTAB, did not appreciate any wheezes, rales, or rhonchi ?Abdomen: + BS; soft, ND, NT ?Vascular: 2+ pedal pulses b/l; 2+ radial pulses b/l ?Extremities: no peripheral edema, no muscle wasting ?Neuro: 5/5 strength of the proximal and distal flexors and extensors of the upper and lower extremities bilaterally; sensation intact in upper and lower extremities b/l; cranial nerves II through XII grossly intact; no pronator drift; no evidence suggestive of slurred speech, dysarthria, or facial droop; Normal muscle tone. No tremors. ? ? ?Labs on Admission: I have personally reviewed following labs and imaging studies ? ?CBC: ?Recent Labs  ?Lab 01/22/22 ?2100 01/22/22 ?2107  ?WBC 5.9  --   ?NEUTROABS 3.3  --   ?  HGB 16.5 17.0  ?HCT 48.2 50.0  ?MCV 102.6*  --   ?PLT 198  --   ? ?Basic Metabolic Panel: ?Recent Labs  ?Lab 01/22/22 ?2100 01/22/22 ?2107  ?NA 136 138  ?K 3.9 3.9  ?CL 104 102  ?CO2 24  --   ?GLUCOSE 140* 136*  ?BUN 10 11  ?CREATININE 0.93 0.80  ?CALCIUM 9.1  --   ? ?GFR: ?Estimated Creatinine Clearance: 98.2 mL/min (by C-G formula based on SCr of 0.8 mg/dL). ?Liver Function Tests: ?Recent Labs  ?Lab 01/22/22 ?2100  ?AST 30  ?ALT 25  ?ALKPHOS 61  ?BILITOT 1.5*  ?PROT 6.3*  ?ALBUMIN 3.6  ? ?No results for input(s): LIPASE, AMYLASE in the last 168 hours. ?No results for input(s): AMMONIA in the last 168 hours. ?Coagulation Profile: ?Recent Labs  ?Lab 01/22/22 ?2100  ?INR 1.0  ? ?Cardiac Enzymes: ?No  results for input(s): CKTOTAL, CKMB, CKMBINDEX, TROPONINI in the last 168 hours. ?BNP (last 3 results) ?No results for input(s): PROBNP in the last 8760 hours. ?HbA1C: ?No results for input(s): HGBA1C in the last 72

## 2022-01-23 NOTE — Progress Notes (Signed)
?  Echocardiogram ?2D Echocardiogram has been performed. ? ?Dennis Vega ?01/23/2022, 12:08 PM ?

## 2022-03-31 ENCOUNTER — Emergency Department (HOSPITAL_COMMUNITY): Payer: Self-pay

## 2022-03-31 ENCOUNTER — Other Ambulatory Visit: Payer: Self-pay

## 2022-03-31 ENCOUNTER — Encounter (HOSPITAL_COMMUNITY): Payer: Self-pay

## 2022-03-31 ENCOUNTER — Emergency Department (HOSPITAL_COMMUNITY)
Admission: EM | Admit: 2022-03-31 | Discharge: 2022-03-31 | Disposition: A | Discharge status: Home / Self Care | Payer: Self-pay | Attending: Emergency Medicine | Admitting: Emergency Medicine

## 2022-03-31 DIAGNOSIS — Z79899 Other long term (current) drug therapy: Secondary | ICD-10-CM | POA: Insufficient documentation

## 2022-03-31 DIAGNOSIS — Z7982 Long term (current) use of aspirin: Secondary | ICD-10-CM | POA: Insufficient documentation

## 2022-03-31 DIAGNOSIS — G529 Cranial nerve disorder, unspecified: Secondary | ICD-10-CM | POA: Insufficient documentation

## 2022-03-31 DIAGNOSIS — I10 Essential (primary) hypertension: Secondary | ICD-10-CM | POA: Insufficient documentation

## 2022-03-31 LAB — I-STAT CHEM 8, ED
BUN: 6 mg/dL (ref 6–20)
Calcium, Ion: 1.22 mmol/L (ref 1.15–1.40)
Chloride: 95 mmol/L — ABNORMAL LOW (ref 98–111)
Creatinine, Ser: 0.7 mg/dL (ref 0.61–1.24)
Glucose, Bld: 133 mg/dL — ABNORMAL HIGH (ref 70–99)
HCT: 53 % — ABNORMAL HIGH (ref 39.0–52.0)
Hemoglobin: 18 g/dL — ABNORMAL HIGH (ref 13.0–17.0)
Potassium: 4.2 mmol/L (ref 3.5–5.1)
Sodium: 135 mmol/L (ref 135–145)
TCO2: 27 mmol/L (ref 22–32)

## 2022-03-31 LAB — CBC WITH DIFFERENTIAL/PLATELET
Abs Immature Granulocytes: 0.01 10*3/uL (ref 0.00–0.07)
Basophils Absolute: 0 10*3/uL (ref 0.0–0.1)
Basophils Relative: 1 %
Eosinophils Absolute: 0 10*3/uL (ref 0.0–0.5)
Eosinophils Relative: 1 %
HCT: 48.9 % (ref 39.0–52.0)
Hemoglobin: 17.2 g/dL — ABNORMAL HIGH (ref 13.0–17.0)
Immature Granulocytes: 0 %
Lymphocytes Relative: 40 %
Lymphs Abs: 1.7 10*3/uL (ref 0.7–4.0)
MCH: 34.8 pg — ABNORMAL HIGH (ref 26.0–34.0)
MCHC: 35.2 g/dL (ref 30.0–36.0)
MCV: 99 fL (ref 80.0–100.0)
Monocytes Absolute: 0.5 10*3/uL (ref 0.1–1.0)
Monocytes Relative: 11 %
Neutro Abs: 2 10*3/uL (ref 1.7–7.7)
Neutrophils Relative %: 47 %
Platelets: 187 10*3/uL (ref 150–400)
RBC: 4.94 MIL/uL (ref 4.22–5.81)
RDW: 12.3 % (ref 11.5–15.5)
WBC: 4.2 10*3/uL (ref 4.0–10.5)
nRBC: 0 % (ref 0.0–0.2)

## 2022-03-31 MED ORDER — IOHEXOL 350 MG/ML SOLN
50.0000 mL | Freq: Once | INTRAVENOUS | Status: AC | PRN
  Administered 2022-03-31: 50 mL via INTRAVENOUS

## 2022-03-31 MED ORDER — AMLODIPINE BESYLATE 5 MG PO TABS
5.0000 mg | ORAL_TABLET | Freq: Every day | ORAL | 0 refills | Status: DC
  Filled 2022-03-31: qty 30, 30d supply, fill #0

## 2022-03-31 MED ORDER — SODIUM CHLORIDE 0.9% FLUSH: 3.0000 mL | Freq: Once | INTRAVENOUS | Status: DC

## 2022-03-31 NOTE — ED Notes (Signed)
PT in MRI.

## 2022-03-31 NOTE — H&P (Signed)
Transport was sent for pt @12 :45pm. While in the waiting room Transport called pt's name multiple times and searched for pt but was unable to get a response or find him.

## 2022-03-31 NOTE — ED Notes (Addendum)
Patient transported to CT, will get vitals once patient returned

## 2022-03-31 NOTE — ED Provider Triage Note (Signed)
Emergency Medicine Provider Triage Evaluation Note  Dennis Vega , a 61 y.o. male  was evaluated in triage.  Patient presenting with right eye trouble.  Reports that he feels like when he is looking far to the right his vision gets blurry and doubled in his right eye.  No eye pain, discharge or photophobia.  No weakness, sensation deficits, slurred speech or confusion.  No history of CVA.  History of hypertension and hyperlipidemia  Review of Systems  Positive: Blurred vision and diplopia Negative: As above  Physical Exam  BP 120/77 (BP Location: Left Arm)   Pulse 68   Temp 97.7 F (36.5 C) (Oral)   Resp 16   Ht 5\' 9"  (1.753 m)   Wt 81.6 kg   SpO2 100%   BMI 26.58 kg/m  Gen:   Awake, no distress   Resp:  Normal effort  MSK:   Moves extremities without difficulty  Other:  Patient with right-sided exotropia.  Horizontal nystagmus noted on EOMs.  No visual field deficit in either eye or both together.  Right eye appears dilated when compared with the left, still reactive to light.  Medical Decision Making  Medically screening exam initiated at 11:52 AM.  Appropriate orders placed.  Dennis Vega was informed that the remainder of the evaluation will be completed by another provider, this initial triage assessment does not replace that evaluation, and the importance of remaining in the ED until their evaluation is complete.  I had my attending MD Schlossman come and see this patient.  She agrees that there is normal strength.  Patient told her that his last known normal was around 2 PM yesterday.  Spoke with Dr. Susanne Borders who agrees that no code stroke should be called but that patient should undergo MRI imaging.  We will also order CTA for potential aneurysm.   Dennis Corner, PA-C 03/31/22 1212

## 2022-03-31 NOTE — ED Notes (Signed)
Patient stated that he is starting to feel nauseated. This tech made triage RN and charge RN aware

## 2022-03-31 NOTE — ED Provider Notes (Addendum)
[facility] EMERGENCY DEPARTMENT Provider Note   CSN: [id number] Arrival date & time: 03/31/22  1130     History  Chief Complaint  Patient presents with   Eye Problem    Dennis Vega is a 61 y.o. male with a past medical history of hypertension and TIA in May presenting today with diplopia.  He says this has been going on since yesterday morning.  Says that when he moves his right eye all the way out laterally he starts to feels that his vision is blurred with diplopia.  No pain, photophobia or discharge.  No weakness, slurred speech or confusion.   Eye Problem      Home Medications Prior to Admission medications   Medication Sig Start Date End Date Taking? Authorizing Provider  amLODipine (NORVASC) 5 MG tablet Take 1 tablet (5 mg total) by mouth daily. 01/23/22 02/22/22  Hughie Closs, MD  aspirin EC 81 MG EC tablet Take 1 tablet (81 mg total) by mouth daily. Swallow whole. 01/24/22   Hughie Closs, MD  atorvastatin (LIPITOR) 40 MG tablet Take 1 tablet (40 mg total) by mouth daily. 01/23/22 02/22/22  Hughie Closs, MD      Allergies    Patient has no known allergies.    Review of Systems   Review of Systems  Physical Exam Updated Vital Signs BP (!) 141/77   Pulse 71   Temp 97.7 F (36.5 C) (Oral)   Resp 16   Ht 5\' 9"  (1.753 m)   Wt 81.6 kg   SpO2 99%   BMI 26.58 kg/m  Physical Exam Vitals and nursing note reviewed.  Constitutional:      Appearance: Normal appearance.  HENT:     Head: Normocephalic and atraumatic.  Eyes:     General: No scleral icterus.    Conjunctiva/sclera: Conjunctivae normal.     Comments:  Patient with right-sided exotropia.  Horizontal nystagmus noted on EOMs.  No visual field deficit in either eye or both together.  Right eye appears dilated when compared with the left, still reactive to light.  Pulmonary:     Effort: Pulmonary effort is normal. No respiratory distress.  Skin:    Findings: No rash.  Neurological:      Mental Status: He is alert.  Psychiatric:        Mood and Affect: Mood normal.     ED Results / Procedures / Treatments   Labs (all labs ordered are listed, but only abnormal results are displayed) Labs Reviewed  CBC WITH DIFFERENTIAL/PLATELET - Abnormal; Notable for the following components:      Result Value   Hemoglobin 17.2 (*)    MCH 34.8 (*)    All other components within normal limits  I-STAT CHEM 8, ED - Abnormal; Notable for the following components:   Chloride 95 (*)    Glucose, Bld 133 (*)    Hemoglobin 18.0 (*)    HCT 53.0 (*)    All other components within normal limits  COMPREHENSIVE METABOLIC PANEL  PROTIME-INR  APTT  CBG MONITORING, ED    EKG EKG Interpretation  Date/Time:  Wednesday March 31 2022 12:28:42 EDT Ventricular Rate:  77 PR Interval:  142 QRS Duration: 84 QT Interval:  438 QTC Calculation: 495 R Axis:   19 Text Interpretation: Normal sinus rhythm Prolonged QT Abnormal ECG When compared with ECG of 22-Jan-2022 21:36, No significant change since last tracing Confirmed by 24-Jan-2022 (Alvira Monday) on 03/31/2022 2:56:15 PM  Radiology MR BRAIN WO  CONTRAST  Result Date: 03/31/2022 CLINICAL DATA:  Diplopia. EXAM: MRI HEAD WITHOUT CONTRAST TECHNIQUE: Multiplanar, multiecho pulse sequences of the brain and surrounding structures were obtained without intravenous contrast. COMPARISON:  MRI of the head without contrast 01/23/2022 FINDINGS: Brain: No acute infarct, hemorrhage, or mass lesion is present. The ventricles are of normal size. Moderate atrophy and white matter changes are stable. The ventricles are of normal size. No significant extraaxial fluid collection is present. The internal auditory canals are within normal limits. The brainstem and cerebellum are within normal limits. Vascular: Flow is present in the major intracranial arteries. Skull and upper cervical spine: The craniocervical junction is normal. Upper cervical spine is within normal limits.  Marrow signal is unremarkable. Sinuses/Orbits: Mild mucosal thickening is present the inferior right maxillary sinus. The paranasal sinuses and mastoid air cells are otherwise clear. The globes and orbits are within normal limits. IMPRESSION: 1. No acute intracranial abnormality or significant interval change. 2. Stable moderate atrophy and white matter disease. This likely reflects the sequela of chronic microvascular ischemia. Electronically Signed   By: Marin Roberts M.D.   On: 03/31/2022 15:54   CT ANGIO HEAD NECK W WO CM  Result Date: 03/31/2022 CLINICAL DATA:  Diplopia EXAM: CT ANGIOGRAPHY HEAD AND NECK TECHNIQUE: Multidetector CT imaging of the head and neck was performed using the standard protocol during bolus administration of intravenous contrast. Multiplanar CT image reconstructions and MIPs were obtained to evaluate the vascular anatomy. Carotid stenosis measurements (when applicable) are obtained utilizing NASCET criteria, using the distal internal carotid diameter as the denominator. RADIATION DOSE REDUCTION: This exam was performed according to the departmental dose-optimization program which includes automated exposure control, adjustment of the mA and/or kV according to patient size and/or use of iterative reconstruction technique. CONTRAST:  73mL OMNIPAQUE IOHEXOL 350 MG/ML SOLN COMPARISON:  CT head and CTA head/neck May 5, 23. FINDINGS: CT HEAD FINDINGS Brain: No evidence of acute large vascular territory infarction, hemorrhage, hydrocephalus, extra-axial collection or mass lesion/mass effect. 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 Mild additional scattered white matter hypodensities, nonspecific but compatible with chronic microvascular ischemic disease. Vascular: Evaluate below. Skull: No acute fracture. Sinuses: Retention cyst and mucosal thickening in the inferior right maxillary sinus. Otherwise, sinuses are clear. Orbits: No acute finding. Review of the MIP images  confirms the above findings CTA NECK FINDINGS Aortic arch: Great vessel origins are patent without significant stenosis. Right carotid system: No evidence of dissection, stenosis (50% or greater) or occlusion. Left carotid system: No evidence of dissection, stenosis (50% or greater) or occlusion. Vertebral arteries: Left dominant. No evidence of dissection, stenosis (50% or greater) or occlusion. 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 Skeleton: Multilevel degenerative change, including bridging osteophytes. Other neck: No acute findings. Upper chest: Visualized lung apices are clear. Review of the MIP images confirms the above findings CTA HEAD FINDINGS Anterior circulation: Bilateral intracranial ICAs, MCAs, and ACAs are patent without proximal hemodynamically significant stenosis. Hypoplastic left A1 ACA, probably congenital. Partially azygos ACA, anatomic variant. Posterior circulation: Left dominant intradural vertebral artery. Bilateral intradural vertebral arteries, basilar artery and both posterior cerebral arteries are patent. Similar moderate stenosis of the basilar artery. Multifocal moderate to severe stenosis of the right P2 PCA. Venous sinuses: Limited assessment due to non arterial timing. Anatomic variants: Detailed above. Review of the MIP images confirms the above findings IMPRESSION: 1. No evidence of acute intracranial abnormality. 2. No emergent large vessel occlusion. 3. 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 4. Moderate basilar artery stenosis. Electronically Signed   By: Feliberto Harts M.D.   On: 03/31/2022 13:37    Procedures Procedures    Medications Ordered in ED Medications  sodium chloride flush (NS) 0.9 % injection 3 mL (has no administration in time range)  iohexol (OMNIPAQUE) 350 MG/ML injection 50 mL (50 mLs Intravenous Contrast Given 03/31/22 1322)    ED Course/ Medical Decision Making/ A&P                            Medical Decision Making Amount and/or Complexity of Data Reviewed Labs: ordered. Radiology: ordered.  Risk Prescription drug management.   This patient presents to the ED for concern of diplopia and blurred vision.  Differential includes but is not limited to brain mass, hemorrhage, ischemia, TIA, MS, cranial nerve palsy.   This is not an exhaustive differential.    Past Medical History / Co-morbidities / Social History: Uncontrolled hypertension   Additional history: Per chart review patient presented with diplopia in May of this year.  He presented with diplopia and dizziness.  He was admitted to the hospital for TIA work-up.  Work-up was negative.  He was encouraged to take his antihypertensives at that time.   Physical Exam: Pertinent physical exam findings include right-sided exotropia.  Horizontal nystagmus on EOMs.  Lab Tests: I ordered, and personally interpreted labs.  The pertinent results include:  -No concerning labs   Imaging Studies: I ordered and independently visualized and interpreted CTA and brain MRI.  CTA with no signs of LVO.  MRI within normal limits     Consultations Obtained: I spoke with Dr. Jerrell Belfast with neurology while I was in triage with the patient.  It was suggested to do an MRI however patient did not meet the criteria for LVO code stroke.  I called him back after the results and he said the patient was stable for discharge with outpatient ophthalmology and neurology follow-up.  Disposition: 61 year old male presented today with blurred vision and diplopia.  He had a similar presentation a few months ago.  Work-up negative for CVA.  Spoke with neurology who agrees that he is stable for discharge home with follow-up with neurology and ophthalmology.  Potential cranial nerve palsy instead of intracranial abnormality.  Patient is currently homeless and unable to afford medical treatment.  Social work involved.  Discharge papers may be printed  after social work signed off.   I discussed this case with my attending physician Dr. Dalene Seltzer who cosigned this note including patient's presenting symptoms, physical exam, and planned diagnostics and interventions. Attending physician stated agreement with plan or made changes to plan which were implemented.      Final Clinical Impression(s) / ED Diagnoses Final diagnoses:  Cranial nerve palsy    Rx / DC Orders ED Discharge Orders          Ordered    amLODipine (NORVASC) 5 MG tablet  Daily        03/31/22 1843           Results and diagnoses were explained to the patient. Return precautions discussed in full. Patient had no additional questions and expressed complete understanding.   This chart was dictated using voice recognition software.  Despite best efforts to proofread,  errors can occur which can change the documentation meaning.     Woodroe Chen 03/31/22 1843  Notified by case worker and social  work that there is not much that we are able to offer the patient.  I stressed the importance of following up with a specialist and he was discharged home.   Saddie Benders, PA-C 03/31/22 1851    Alvira Monday, MD 03/31/22 2349

## 2022-03-31 NOTE — Discharge Instructions (Addendum)
  Get in contact with either [facility] or [facility] for general primary care.  These offices will be free of charge.  They will be able to refer you to an eye specialist and a neurologist.  I also attached our own eye doctor and neurologist but I would start with the primary care offices above.  Be sure to take your blood pressure medication daily.  Get in contact with either [facility] or [facility] for general primary care.

## 2022-03-31 NOTE — ED Triage Notes (Signed)
Coming from shelter and had episode yesterday where if he looks to far to right he has double vision.  Continuing today and complains of right eye numbness.  LVO - and alert and oriented x 4.  No thinners no falls.

## 2022-04-01 ENCOUNTER — Other Ambulatory Visit (HOSPITAL_COMMUNITY): Payer: Self-pay

## 2022-04-01 ENCOUNTER — Encounter (HOSPITAL_COMMUNITY): Payer: Self-pay | Admitting: Emergency Medicine

## 2022-04-01 ENCOUNTER — Observation Stay (HOSPITAL_COMMUNITY)
Admission: EM | Admit: 2022-04-01 | Discharge: 2022-04-03 | Disposition: A | Discharge status: Home / Self Care | Payer: Self-pay | Attending: Emergency Medicine | Admitting: Emergency Medicine

## 2022-04-01 DIAGNOSIS — I639 Cerebral infarction, unspecified: Principal | ICD-10-CM | POA: Insufficient documentation

## 2022-04-01 DIAGNOSIS — Z79899 Other long term (current) drug therapy: Secondary | ICD-10-CM | POA: Insufficient documentation

## 2022-04-01 DIAGNOSIS — H532 Diplopia: Secondary | ICD-10-CM

## 2022-04-01 DIAGNOSIS — F1010 Alcohol abuse, uncomplicated: Secondary | ICD-10-CM | POA: Diagnosis present

## 2022-04-01 DIAGNOSIS — I10 Essential (primary) hypertension: Secondary | ICD-10-CM | POA: Insufficient documentation

## 2022-04-01 DIAGNOSIS — Z8673 Personal history of transient ischemic attack (TIA), and cerebral infarction without residual deficits: Secondary | ICD-10-CM | POA: Insufficient documentation

## 2022-04-01 DIAGNOSIS — Z7982 Long term (current) use of aspirin: Secondary | ICD-10-CM | POA: Insufficient documentation

## 2022-04-01 DIAGNOSIS — E782 Mixed hyperlipidemia: Secondary | ICD-10-CM | POA: Diagnosis present

## 2022-04-01 NOTE — ED Provider Triage Note (Signed)
Emergency Medicine Provider Triage Evaluation Note  Dennis Vega , a 61 y.o. male  was evaluated in triage.  Pt complains of diplopia onset yesterday morning.  His diplopia is worse when looking to the right.  He was evaluated in the ED yesterday. Pt notes that his symptoms are the same as yesterday and no acute change noted. No meds tried. Denies headache, numbness, tingling, weakness. Pt notes he is here today because he would like medications to aid with his diplopia as he cannot work like that. Also notes that he would like a thorough evaluation.   Per patient chart review: Patient was evaluated in the emergency department with a negative work-up consistent of CTA head and neck, MRI brain.  Neurologist was consulted at the time who recommended follow-up in the outpatient setting with ophthalmology and neurology.   Review of Systems  Positive: As per HPI Negative:   Physical Exam  BP 127/85 (BP Location: Right Arm)   Pulse 84   Temp 98.2 F (36.8 C) (Oral)   Resp 15   SpO2 98%  Gen:   Awake, no distress   Resp:  Normal effort  MSK:   Moves extremities without difficulty  Other:  Negative pronator drift. Strength and sensation intact to BUE and BLE. Right sided exotropia. EOMI. PERRLA.  Medical Decision Making  Medically screening exam initiated at 1:31 PM.  Appropriate orders placed.  Dennis Vega was informed that the remainder of the evaluation will be completed by another provider, this initial triage assessment does not replace that evaluation, and the importance of remaining in the ED until their evaluation is complete.  Work-up initiated.    Shaley Leavens A, PA-C 04/01/22 1344

## 2022-04-01 NOTE — ED Triage Notes (Signed)
Patient here with daughter requesting re-evaluation of symptoms. Patient complains of right sided facial droop, loss of balance, and disconjugate gaze that started Tuesday. Patient has no arm or leg drift, no ataxia, no dysarthria, no sensation deficits. Patient is alert, oriented, and in no apparent distress at this time.

## 2022-04-02 ENCOUNTER — Encounter (HOSPITAL_COMMUNITY): Payer: Self-pay | Admitting: Internal Medicine

## 2022-04-02 ENCOUNTER — Other Ambulatory Visit: Payer: Self-pay

## 2022-04-02 ENCOUNTER — Emergency Department (HOSPITAL_COMMUNITY): Payer: Self-pay

## 2022-04-02 DIAGNOSIS — E782 Mixed hyperlipidemia: Secondary | ICD-10-CM

## 2022-04-02 DIAGNOSIS — I639 Cerebral infarction, unspecified: Principal | ICD-10-CM | POA: Diagnosis present

## 2022-04-02 DIAGNOSIS — I10 Essential (primary) hypertension: Secondary | ICD-10-CM

## 2022-04-02 DIAGNOSIS — H532 Diplopia: Secondary | ICD-10-CM

## 2022-04-02 DIAGNOSIS — F1010 Alcohol abuse, uncomplicated: Secondary | ICD-10-CM

## 2022-04-02 LAB — CBC WITH DIFFERENTIAL/PLATELET
Abs Immature Granulocytes: 0.02 10*3/uL (ref 0.00–0.07)
Basophils Absolute: 0 10*3/uL (ref 0.0–0.1)
Basophils Relative: 1 %
Eosinophils Absolute: 0.1 10*3/uL (ref 0.0–0.5)
Eosinophils Relative: 2 %
HCT: 51.1 % (ref 39.0–52.0)
Hemoglobin: 18 g/dL — ABNORMAL HIGH (ref 13.0–17.0)
Immature Granulocytes: 1 %
Lymphocytes Relative: 48 %
Lymphs Abs: 2 10*3/uL (ref 0.7–4.0)
MCH: 34.9 pg — ABNORMAL HIGH (ref 26.0–34.0)
MCHC: 35.2 g/dL (ref 30.0–36.0)
MCV: 99 fL (ref 80.0–100.0)
Monocytes Absolute: 0.5 10*3/uL (ref 0.1–1.0)
Monocytes Relative: 12 %
Neutro Abs: 1.4 10*3/uL — ABNORMAL LOW (ref 1.7–7.7)
Neutrophils Relative %: 36 %
Platelets: UNDETERMINED 10*3/uL (ref 150–400)
RBC: 5.16 MIL/uL (ref 4.22–5.81)
RDW: 12.5 % (ref 11.5–15.5)
WBC: 4 10*3/uL (ref 4.0–10.5)
nRBC: 0 % (ref 0.0–0.2)

## 2022-04-02 LAB — BASIC METABOLIC PANEL
Anion gap: 12 (ref 5–15)
BUN: 13 mg/dL (ref 6–20)
CO2: 24 mmol/L (ref 22–32)
Calcium: 9.6 mg/dL (ref 8.9–10.3)
Chloride: 99 mmol/L (ref 98–111)
Creatinine, Ser: 1.23 mg/dL (ref 0.61–1.24)
GFR, Estimated: >60 mL/min (ref >60)
Glucose, Bld: 127 mg/dL — ABNORMAL HIGH (ref 70–99)
Potassium: 4.6 mmol/L (ref 3.5–5.1)
Sodium: 135 mmol/L (ref 135–145)

## 2022-04-02 LAB — LIPID PANEL
Cholesterol: 202 mg/dL — ABNORMAL HIGH (ref 0–200)
HDL: 69 mg/dL (ref >40)
LDL Cholesterol: 120 mg/dL — ABNORMAL HIGH (ref 0–99)
Total CHOL/HDL Ratio: 2.9 RATIO
Triglycerides: 63 mg/dL (ref <150)
VLDL: 13 mg/dL (ref 0–40)

## 2022-04-02 LAB — PROTIME-INR
INR: 1 (ref 0.8–1.2)
Prothrombin Time: 13.3 seconds (ref 11.4–15.2)

## 2022-04-02 LAB — HEMOGLOBIN A1C
Hgb A1c MFr Bld: 5.1 % (ref 4.8–5.6)
Mean Plasma Glucose: 99.67 mg/dL

## 2022-04-02 LAB — CBG MONITORING, ED: Glucose-Capillary: 123 mg/dL — ABNORMAL HIGH (ref 70–99)

## 2022-04-02 LAB — RAPID URINE DRUG SCREEN, HOSP PERFORMED
Amphetamines: NOT DETECTED
Barbiturates: NOT DETECTED
Benzodiazepines: NOT DETECTED
Cocaine: NOT DETECTED
Opiates: NOT DETECTED
Tetrahydrocannabinol: NOT DETECTED

## 2022-04-02 LAB — APTT: aPTT: 21 seconds — ABNORMAL LOW (ref 24–36)

## 2022-04-02 LAB — ETHANOL: Alcohol, Ethyl (B): <10 mg/dL (ref <10)

## 2022-04-02 LAB — HIV ANTIBODY (ROUTINE TESTING W REFLEX): HIV Screen 4th Generation wRfx: NONREACTIVE

## 2022-04-02 MED ORDER — HYDRALAZINE HCL 20 MG/ML IJ SOLN: 10.0000 mg | Freq: Four times a day (QID) | INTRAMUSCULAR | Status: DC | PRN

## 2022-04-02 MED ORDER — ONDANSETRON HCL 4 MG PO TABS: 4.0000 mg | ORAL_TABLET | Freq: Four times a day (QID) | ORAL | Status: DC | PRN

## 2022-04-02 MED ORDER — CLOPIDOGREL BISULFATE 75 MG PO TABS: 75.0000 mg | ORAL_TABLET | Freq: Every day | ORAL | Status: DC

## 2022-04-02 MED ORDER — ATORVASTATIN CALCIUM 80 MG PO TABS
80.0000 mg | ORAL_TABLET | Freq: Every day | ORAL | Status: DC
  Administered 2022-04-02 – 2022-04-03 (×2): 80 mg via ORAL
  Filled 2022-04-02: qty 3
  Filled 2022-04-02: qty 2
  Filled 2022-04-02 (×2): qty 3
  Filled 2022-04-02: qty 1

## 2022-04-02 MED ORDER — ENOXAPARIN SODIUM 40 MG/0.4ML IJ SOSY
40.0000 mg | PREFILLED_SYRINGE | Freq: Every day | INTRAMUSCULAR | Status: DC
  Administered 2022-04-02 – 2022-04-03 (×2): 40 mg via SUBCUTANEOUS
  Filled 2022-04-02 (×2): qty 0.4

## 2022-04-02 MED ORDER — CLOPIDOGREL BISULFATE 300 MG PO TABS
300.0000 mg | ORAL_TABLET | Freq: Once | ORAL | Status: AC
  Administered 2022-04-02: 300 mg via ORAL
  Filled 2022-04-02: qty 1

## 2022-04-02 MED ORDER — ACETAMINOPHEN 325 MG PO TABS: 650.0000 mg | ORAL_TABLET | Freq: Four times a day (QID) | ORAL | Status: DC | PRN

## 2022-04-02 MED ORDER — ATORVASTATIN CALCIUM 40 MG PO TABS: 40.0000 mg | ORAL_TABLET | Freq: Every day | ORAL | Status: DC

## 2022-04-02 MED ORDER — POLYETHYLENE GLYCOL 3350 17 G PO PACK: 17.0000 g | PACK | Freq: Every day | ORAL | Status: DC | PRN

## 2022-04-02 MED ORDER — ACETAMINOPHEN 650 MG RE SUPP: 650.0000 mg | Freq: Four times a day (QID) | RECTAL | Status: DC | PRN

## 2022-04-02 MED ORDER — CLOPIDOGREL BISULFATE 75 MG PO TABS
75.0000 mg | ORAL_TABLET | Freq: Every day | ORAL | Status: DC
  Administered 2022-04-03: 75 mg via ORAL
  Filled 2022-04-02 (×2): qty 3
  Filled 2022-04-02: qty 1
  Filled 2022-04-02: qty 3

## 2022-04-02 MED ORDER — ASPIRIN 81 MG PO TBEC
81.0000 mg | DELAYED_RELEASE_TABLET | Freq: Every day | ORAL | Status: DC
  Administered 2022-04-02 – 2022-04-03 (×2): 81 mg via ORAL
  Filled 2022-04-02 (×2): qty 3
  Filled 2022-04-02: qty 1
  Filled 2022-04-02: qty 3
  Filled 2022-04-02: qty 1

## 2022-04-02 MED ORDER — ONDANSETRON HCL 4 MG/2ML IJ SOLN: 4.0000 mg | Freq: Four times a day (QID) | INTRAMUSCULAR | Status: DC | PRN

## 2022-04-02 MED ORDER — STROKE: EARLY STAGES OF RECOVERY BOOK
Freq: Once | Status: AC
  Filled 2022-04-02: qty 1

## 2022-04-02 MED ORDER — CLOPIDOGREL BISULFATE 300 MG PO TABS: 300.0000 mg | ORAL_TABLET | Freq: Once | ORAL | Status: DC

## 2022-04-02 NOTE — Assessment & Plan Note (Signed)
   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 Without associated hemorrhage or mass effect.  Performing serial neurologic checks  Monitoring patient on telemetry  Initiating antiplatelet therapy including aspirin and plavix  Per neurology recommendations increasing atorvastatin therapy to 80 mg daily  Obtaining hemoglobin A1c and lipid panel  Echocardiogram in the morning with bubble study  PT, OT, SLP evaluation  Permissive hypertension with as needed antihypertensives only to be given if blood pressure greater than 220/115  Neurology following in consultation, their assistance is appreciated.

## 2022-04-02 NOTE — Consult Note (Signed)
Neurology Consultation Reason for Consult: Diplopia Referring Physician: Madilyn Hook, E  CC: Diplopia  History is obtained from: Patient  HPI: Cole Eastridge is a 61 y.o. male who states that he has had diplopia for about 4 days.  He states that was relatively sudden onset, and has been present since that time.  He was seen yesterday and evaluated with CTA/MRI which were negative and therefore there was concern for cranial nerve palsy and was referred to ophthalmology.  Due to symptoms persisting, he sought reevaluation today.  Neurology has been consulted for further evaluation.   LKW: 4 days ago tpa given?: no, outside of window    Past Medical History:  Diagnosis Date   Hypertension    Pneumonia      Family History  Problem Relation Age of Onset   Stroke Mother      Social History:  reports that he has never smoked. He has never used smokeless tobacco. He reports current alcohol use. He reports that he does not currently use drugs after having used the following drugs: "Crack" cocaine and Marijuana.   Exam: Current vital signs: BP (!) 159/78   Pulse 87   Temp 98 F (36.7 C)   Resp 17   SpO2 98%  Vital signs in last 24 hours: Temp:  [98 F (36.7 C)-98.8 F (37.1 C)] 98 F (36.7 C) (07/14 0128) Pulse Rate:  [75-87] 87 (07/14 0128) Resp:  [15-18] 17 (07/14 0128) BP: (120-159)/(78-85) 159/78 (07/14 0128) SpO2:  [98 %-100 %] 98 % (07/14 0128)   Physical Exam  Constitutional: Appears well-developed and well-nourished.  Psych: Affect appropriate to situation Eyes: No scleral injection HENT: No OP obstruction MSK: no joint deformities.  Cardiovascular: Normal rate and regular rhythm.  Respiratory: Effort normal, non-labored breathing GI: Soft.  No distension. There is no tenderness.  Skin: WDI  Neuro: Mental Status: Patient is awake, alert, oriented to person, place, month, year, and situation. Patient is able to give a clear and coherent history. No signs of  aphasia or neglect Cranial Nerves: II: Visual Fields are full.  Left eye is very slightly larger than right, both are reactive III,IV, VI: He has a 1-1/2 syndrome, with no lateral gaze in the left eye and only abduction with nystagmus in the right eye V: Facial sensation is symmetric to temperature VII: Facial movement with left facial weakness VIII: hearing is intact to voice X: Uvula elevates symmetrically XI: Shoulder shrug is symmetric. XII: tongue is midline without atrophy or fasciculations.  Motor: Tone is normal. Bulk is normal. 5/5 strength was present on the left side, he has 4+/5 strength of the right arm and leg Sensory: Sensation is symmetric to light touch and temperature in the arms and legs. Cerebellar: ?  Mild left ataxia    I have reviewed labs in epic and the results pertinent to this consultation are: LDL 120 BMP unremarkable  I have reviewed the images obtained: MRI brain - negative CT-negative  Impression: 61 year old male with 1-1/2 syndrome, right-sided weakness,, left-sided facial droop.  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  Though the MRI history is negative, I suspect that it is relatively small and was between cuts.  Repeat MRI might be helpful to capture it.  Recommendations: - HgbA1c -More aggressive statin therapy, recommend increasing Lipitor to 80 mg daily - Frequent neuro checks - Echocardiogram - Prophylactic therapy-Antiplatelet med: Aspirin - dose 81mg  and plavix 75mg  daily  after 300mg  load  - Risk factor modification - Telemetry monitoring - PT consult, OT consult, Speech consult - Stroke team to follow    , MD [facility] [phone]  If 7pm- 7am, please page neurology on call as listed in AMION.

## 2022-04-02 NOTE — Hospital Course (Signed)
61 year old male with past medical history of hypertension, hyperlipidemia, history of alcohol abuse with delirium tremens who presents to [facility] emergency department with complaints of right facial droop loss of balance and visual changes.   He was admitted with Dennis Vega stroke.  Seen by neurology recommending aspirin/plavix x3 weeks, then aspirin alone.  Lipitor.  He'll need outpatient therapy.  See below for additional details

## 2022-04-02 NOTE — H&P (Signed)
History and Physical    Patient: Dennis Vega MRN: [id number] DOA: 04/01/2022  Date of Service: the patient was seen and examined on 04/02/2022  Patient coming from: Home  Chief Complaint:  Chief Complaint  Patient presents with   Diplopia    HPI:   61 year old male with past medical history of hypertension, hyperlipidemia, history of alcohol abuse with delirium tremens who presents to [facility] emergency department with complaints of right facial droop loss of balance and visual changes.  Of note, patient was recently hospitalized at [facility] from 5/5 until 5/6 for diplopia.  Patient was monitored overnight with work-up not identifying an ischemic insult.  Patient is symptoms had resolved prior to discharge.  Patient was discharged on 5/6.  Patient then presented to [facility] emergency department on 7/12 with recurrent complaints of blurry vision and diplopia.  Work-up once again was performed including MRI brain which did not identify any acute stroke.  Patient was discharged home.  Since patient left the emergency room, symptoms of diplopia are now associated with some right-sided weakness and left facial droop.  Have continued to persist overnight.  Patient denies associated headaches continue to persist overnight due to persisting symptoms.  Patient complains of some intermittent loss of balance but denies any focal weakness or slurring of his words.    Patient has returned back to the emergency department on 7/13.  Patient was evaluated by Dr. Amada Jupiter with neurology who clinically felt the patient was exhibiting signs and symptoms of a stroke.  An MRI brain without contrast was repeated revealing few small acute ischemic infarcts involving the left paramedian pontomedullary junction.  Aspirin and Plavix were initiated.  The hospitalist group was then called to assess the patient for admission to the hospital.  Review of Systems: Review of Systems   Eyes:  Positive for double vision.  Neurological:  Positive for dizziness.  All other systems reviewed and are negative.    Past Medical History:  Diagnosis Date   Hypertension    Pneumonia     Past Surgical History:  Procedure Laterality Date   HAND SURGERY     Right   ORIF MANDIBULAR FRACTURE Bilateral 06/24/2019   Procedure: MMF MANDIBULAR FRACTURE;  Surgeon: Flo Shanks, MD;  Location: [facility] OR;  Service: ENT;  Laterality: Bilateral;    Social History:  reports that he has never smoked. He has never used smokeless tobacco. He reports current alcohol use. He reports that he does not currently use drugs after having used the following drugs: "Crack" cocaine and Marijuana.  No Known Allergies  Family History  Problem Relation Age of Onset   Stroke Mother     Prior to Admission medications   Medication Sig Start Date End Date Taking? Authorizing Provider  amLODipine (NORVASC) 5 MG tablet Take 1 tablet (5 mg total) by mouth daily. 03/31/22 04/30/22  Redwine, Madison A, PA-C  aspirin EC 81 MG EC tablet Take 1 tablet (81 mg total) by mouth daily. Swallow whole. 01/24/22   Hughie Closs, MD  atorvastatin (LIPITOR) 40 MG tablet Take 1 tablet (40 mg total) by mouth daily. 01/23/22 02/22/22  Hughie Closs, MD    Physical Exam:  Vitals:   04/01/22 1626 04/01/22 1943 04/02/22 0128 04/02/22 0658  BP: 129/83 120/82 (!) 159/78 113/71  Pulse: 80 75 87 73  Resp: 18 17 17 16   Temp:  98.8 F (37.1 C) 98 F (36.7 C)   TempSrc:  Oral    SpO2:  100% 98% 98% 99%    Constitutional: Awake alert and oriented x3, no associated distress.   Skin: no rashes, no lesions, good skin turgor noted. Eyes: Pupils are equally reactive to light.  No evidence of scleral icterus or conjunctival pallor.  ENMT: Moist mucous membranes noted.  Posterior pharynx clear of any exudate or lesions.   Neck: normal, supple, no masses, no thyromegaly.  No evidence of jugular venous distension.   Respiratory: clear to  auscultation bilaterally, no wheezing, no crackles. Normal respiratory effort. No accessory muscle use.  Cardiovascular: Regular rate and rhythm, no murmurs / rubs / gallops. No extremity edema. 2+ pedal pulses. No carotid bruits.  Chest:   Nontender without crepitus or deformity.   Back:   Nontender without crepitus or deformity. Abdomen: Abdomen is soft and nontender.  No evidence of intra-abdominal masses.  Positive bowel sounds noted in all quadrants.   Musculoskeletal: No joint deformity upper and lower extremities. Good ROM, no contractures. Normal muscle tone.  Neurologic:  Notable intranuclear ophthalmoplegia of the left eye.  Notable left facial droop.  Otherwise, cranial nerves are grossly intact.  Sensation intact.  Patient moving all 4 extremities spontaneously.  Patient is following all commands.  Patient is responsive to verbal stimuli.   Psychiatric: Patient exhibits depressed mood with flat affect.  Patient seems to possess insight as to their current situation.    Data Reviewed:  I have personally reviewed and interpreted labs, imaging.  Significant findings are:  Chemistry revealing sodium 135, potassium 4.6, bicarbonate 24, glucose 127 CBC revealing white blood cell count of 4.0, hemoglobin 18, hematocrit 51.1 INR 1.0 MRI brain without contrast revealing few small acute ischemic infarcts involving the left paramedian pontomedullary junction with no associated hemorrhage or mass effect.  EKG: Personally reviewed.  Rhythm is Normal sinus rhythm with heart rate of 88 bpm.  No dynamic ST segment changes appreciated.   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 Without associated hemorrhage or mass effect. Performing serial neurologic checks Monitoring patient on telemetry Initiating antiplatelet therapy including aspirin and plavix Per neurology  recommendations increasing atorvastatin therapy to 80 mg daily Obtaining hemoglobin A1c and lipid panel Echocardiogram in the morning with bubble study PT, OT, SLP evaluation Permissive hypertension with as needed antihypertensives only to be given if blood pressure greater than 220/115 Neurology following in consultation, their assistance is appreciated.   Essential hypertension Permissive hypertension for now Holding home regimen of oral antihypertensives As needed intravenous intermittent is for markedly elevated blood pressure of greater than 220/115   Mixed hyperlipidemia Increasing patient's usual regimen of atorvastatin to 80 mg daily per neurology recommendations Lipid panel ordered  Alcohol abuse States he has now reduced his daily usage to 2 drinks daily No clinical evidence of withdrawal at this time Initiating CIWA protocol As needed benzodiazepines will be administered for evidence of withdrawal       Code Status:  Full code  code status decision has been confirmed with: patient Family Communication: deferred   Consults: Dr. Amada Jupiter with Neurology  Severity of Illness:  The appropriate patient status for this patient is OBSERVATION. Observation status is judged to be reasonable and necessary in order to provide the required intensity of service to ensure the patient's safety. The patient's presenting symptoms, physical exam findings, and initial radiographic and laboratory data in the context of their medical condition is felt to  place them at decreased risk for further clinical deterioration. Furthermore, it is anticipated that the patient will be medically stable for discharge from the hospital within 2 midnights of admission.   Author:  Marinda Elk MD  04/02/2022 9:18 AM

## 2022-04-02 NOTE — Evaluation (Signed)
Physical Therapy Evaluation Patient Details Name: Dennis Vega MRN: [id number] DOB: 1961-04-15 Today's Date: 04/02/2022  History of Present Illness  Pt is a 61 y/o male admitted secondary to double vision, instability, and R facial droop. MRI showed L paramedian pontomedulary junction infarcts. PMH includes HTN and alcohol abuse.  Clinical Impression  Pt admitted secondary to problem above with deficits below. Pt requiring min to min guard A for mobility tasks this session. Reporting double vision throughout. One LOB noted, requiring up to mod A for steadying. Recommending outpatient PT at d/c to address current mobility deficits. Will continue to follow acutely.        Recommendations for follow up therapy are one component of a multi-disciplinary discharge planning process, led by the attending physician.  Recommendations may be updated based on patient status, additional functional criteria and insurance authorization.  Follow Up Recommendations Outpatient PT      Assistance Recommended at Discharge Frequent or constant Supervision/Assistance  Patient can return home with the following  A little help with walking and/or transfers;A little help with bathing/dressing/bathroom;Assistance with cooking/housework;Help with stairs or ramp for entrance;Assist for transportation;Direct supervision/assist for financial management;Direct supervision/assist for medications management    Equipment Recommendations Rolling walker (2 wheels)  Recommendations for Other Services       Functional Status Assessment Patient has had a recent decline in their functional status and demonstrates the ability to make significant improvements in function in a reasonable and predictable amount of time.     Precautions / Restrictions Precautions Precautions: Fall Restrictions Weight Bearing Restrictions: No      Mobility  Bed Mobility Overal bed mobility: Needs Assistance Bed Mobility: Supine to Sit, Sit  to Supine     Supine to sit: Supervision Sit to supine: Supervision   General bed mobility comments: supervision for safety    Transfers Overall transfer level: Needs assistance Equipment used: None Transfers: Sit to/from Stand Sit to Stand: Min guard           General transfer comment: Min guard for safety.    Ambulation/Gait Ambulation/Gait assistance: Min assist, Min guard Gait Distance (Feet): 150 Feet Assistive device: None Gait Pattern/deviations: Step-through pattern, Decreased stride length, Drifts right/left Gait velocity: Decreased     General Gait Details: Slow, very cautious gait. Pt with double vision throughout which attributed to balance deficits. One LOB when turning to the R, requiring up to mod A for stability. Otherwise requiring min to min guard A.  Stairs            Wheelchair Mobility    Modified Rankin (Stroke Patients Only)       Balance Overall balance assessment: Needs assistance Sitting-balance support: No upper extremity supported, Feet supported Sitting balance-Leahy Scale: Good     Standing balance support: No upper extremity supported Standing balance-Leahy Scale: Fair Standing balance comment: able to maintain static standing without LOB                             Pertinent Vitals/Pain Pain Assessment Pain Assessment: No/denies pain    Home Living Family/patient expects to be discharged to:: Private residence Living Arrangements: Other relatives (aunt and cousin) Available Help at Discharge: Family;Available PRN/intermittently Type of Home: House Home Access: Stairs to enter Entrance Stairs-Rails: Lawyer of Steps: 4   Home Layout: One level Home Equipment: None      Prior Function Prior Level of Function : Independent/Modified Independent  Hand Dominance        Extremity/Trunk Assessment   Upper Extremity Assessment Upper Extremity  Assessment: Defer to OT evaluation    Lower Extremity Assessment Lower Extremity Assessment: RLE deficits/detail RLE Deficits / Details: grossly 3/5 at hip flexors and 4/5 at knee extensors.    Cervical / Trunk Assessment Cervical / Trunk Assessment: Normal  Communication   Communication: No difficulties  Cognition Arousal/Alertness: Awake/alert Behavior During Therapy: WFL for tasks assessed/performed Overall Cognitive Status: No family/caregiver present to determine baseline cognitive functioning                                          General Comments General comments (skin integrity, edema, etc.): Noted R eye drifting laterally. Unable to track past midline    Exercises     Assessment/Plan    PT Assessment Patient needs continued PT services  PT Problem List Decreased strength;Decreased activity tolerance;Decreased balance;Decreased mobility;Decreased knowledge of use of DME;Decreased knowledge of precautions;Decreased cognition       PT Treatment Interventions DME instruction;Gait training;Functional mobility training;Stair training;Therapeutic exercise;Therapeutic activities;Balance training;Patient/family education    PT Goals (Current goals can be found in the Care Plan section)  Acute Rehab PT Goals Patient Stated Goal: to feel better PT Goal Formulation: With patient Time For Goal Achievement: 04/16/22 Potential to Achieve Goals: Good    Frequency Min 4X/week     Co-evaluation               AM-PAC PT "6 Clicks" Mobility  Outcome Measure Help needed turning from your back to your side while in a flat bed without using bedrails?: A Little Help needed moving from lying on your back to sitting on the side of a flat bed without using bedrails?: A Little Help needed moving to and from a bed to a chair (including a wheelchair)?: A Little Help needed standing up from a chair using your arms (e.g., wheelchair or bedside chair)?: A  Little Help needed to walk in hospital room?: A Little Help needed climbing 3-5 steps with a railing? : A Lot 6 Click Score: 17    End of Session   Activity Tolerance: Patient tolerated treatment well Patient left: in bed (on stretcher in hallway in ED at nurses station) Nurse Communication: Mobility status PT Visit Diagnosis: Unsteadiness on feet (R26.81);Muscle weakness (generalized) (M62.81);Difficulty in walking, not elsewhere classified (R26.2)    Time: 0240-9735 PT Time Calculation (min) (ACUTE ONLY): 17 min   Charges:   PT Evaluation $PT Eval Moderate Complexity: 1 Mod          Farley Ly, PT, DPT  Acute Rehabilitation Services  Office: [phone]   Lehman Prom 04/02/2022, 1:59 PM

## 2022-04-02 NOTE — ED Provider Notes (Signed)
[facility] EMERGENCY DEPARTMENT Provider Note   CSN: [id number] Arrival date & time: 04/01/22  1254     History  Chief Complaint  Patient presents with   Diplopia    Dennis Vega is a 61 y.o. male.  With past medical history of hypertension, alcohol use disorder, hyperlipidemia, TIA who presents to the emergency department with diplopia  Patient states that he was here yesterday for same complaint and was evaluated.  He states that his symptoms have not improved and he is still concerned.  He states that 4 days ago he began having double vision and blurry vision particularly in his right eye.  He states that additionally his right eye feels like it is "bouncing" when he looks to the right.  He also states that he has felt off balance and like he is going to fall.  He does not feel like he is going to fall in any particular direction.  He denies any numbness or tingling to his face or extremities.  He denies any slurred speech or difficulty swallowing.  Of note he was evaluated yesterday for the same presentation.  He had a CTA head and neck as well as an MRI which were both normal.  He was discharged with neurology and ophthalmology follow-up.  HPI     Home Medications Prior to Admission medications   Medication Sig Start Date End Date Taking? Authorizing Provider  amLODipine (NORVASC) 5 MG tablet Take 1 tablet (5 mg total) by mouth daily. 03/31/22 04/30/22  Redwine, Madison A, PA-C  aspirin EC 81 MG EC tablet Take 1 tablet (81 mg total) by mouth daily. Swallow whole. 01/24/22   Hughie Closs, MD  atorvastatin (LIPITOR) 40 MG tablet Take 1 tablet (40 mg total) by mouth daily. 01/23/22 02/22/22  Hughie Closs, MD      Allergies    Patient has no known allergies.    Review of Systems   Review of Systems  Eyes:  Positive for visual disturbance.  Musculoskeletal:  Positive for gait problem.  All other systems reviewed and are negative.   Physical Exam Updated  Vital Signs BP 120/82 (BP Location: Right Arm)   Pulse 75   Temp 98.8 F (37.1 C) (Oral)   Resp 17   SpO2 98%  Physical Exam Vitals and nursing note reviewed.  Constitutional:      General: He is not in acute distress.    Appearance: Normal appearance. He is normal weight. He is not ill-appearing.  HENT:     Head: Normocephalic and atraumatic.     Mouth/Throat:     Mouth: Mucous membranes are moist.     Pharynx: Oropharynx is clear.  Eyes:     General: No visual field deficit or scleral icterus.    Pupils: Pupils are equal, round, and reactive to light.  Cardiovascular:     Rate and Rhythm: Normal rate and regular rhythm.     Pulses: Normal pulses.  Pulmonary:     Effort: Pulmonary effort is normal. No respiratory distress.  Abdominal:     General: Bowel sounds are normal.     Palpations: Abdomen is soft.  Musculoskeletal:        General: Normal range of motion.     Cervical back: Neck supple.  Skin:    General: Skin is warm and dry.     Capillary Refill: Capillary refill takes less than 2 seconds.  Neurological:     Mental Status: He is alert and oriented to  person, place, and time.     Cranial Nerves: Cranial nerve deficit and facial asymmetry present.     Sensory: Sensation is intact. No sensory deficit.     Motor: No weakness or pronator drift.     Coordination: Finger-Nose-Finger Test abnormal. Rapid alternating movements normal.     Deep Tendon Reflexes: Reflexes are normal and symmetric.  Psychiatric:        Mood and Affect: Mood normal.        Behavior: Behavior normal.        Thought Content: Thought content normal.        Judgment: Judgment normal.    ED Results / Procedures / Treatments   Labs (all labs ordered are listed, but only abnormal results are displayed) Labs Reviewed  BASIC METABOLIC PANEL - Abnormal; Notable for the following components:      Result Value   Glucose, Bld 127 (*)    All other components within normal limits  CBC WITH  DIFFERENTIAL/PLATELET - Abnormal; Notable for the following components:   Hemoglobin 18.0 (*)    MCH 34.9 (*)    Neutro Abs 1.4 (*)    All other components within normal limits  APTT - Abnormal; Notable for the following components:   aPTT 21 (*)    All other components within normal limits  LIPID PANEL - Abnormal; Notable for the following components:   Cholesterol 202 (*)    LDL Cholesterol 120 (*)    All other components within normal limits  CBG MONITORING, ED - Abnormal; Notable for the following components:   Glucose-Capillary 123 (*)    All other components within normal limits  PROTIME-INR    EKG None  Radiology MR BRAIN WO CONTRAST  Result Date: 04/02/2022 CLINICAL DATA:  Initial evaluation for neuro deficit, stroke suspected, diplopia. EXAM: MRI HEAD WITHOUT CONTRAST TECHNIQUE: Multiplanar, multiecho pulse sequences of the brain and surrounding structures were obtained without intravenous contrast. COMPARISON:  Prior MRI from 03/31/2022. FINDINGS: Brain: Mild age-related cerebral atrophy. Patchy and confluent T2/FLAIR hyperintensity involving the periventricular and deep white matter both cerebral hemispheres, most consistent with chronic small vessel ischemic disease. Few small foci of restricted diffusion involving the left para median pontomedullary junction are seen, consistent with small acute ischemic infarcts (series 5, image 63). These were likely present on prior brain MRI in retrospect, although difficult to see at that time. No associated hemorrhage or mass effect. No other evidence for acute or subacute ischemia. Gray-white matter differentiation otherwise maintained. No other areas of chronic cortical infarction. No acute intracranial hemorrhage. Single chronic microhemorrhage noted at the right lentiform nucleus, of doubtful significance in isolation. No mass lesion, midline shift or mass effect. No hydrocephalus or extra-axial fluid collection. Pituitary gland and  suprasellar region within normal limits. Vascular: Major intracranial vascular flow voids are maintained. Skull and upper cervical spine: Craniocervical junction within normal limits. Bone marrow signal intensity diffusely heterogeneous without worrisome osseous lesion. No scalp soft tissue abnormality. Sinuses/Orbits: Globes and orbital soft tissues within normal limits. Small right maxillary sinus retention cyst. Paranasal sinuses are otherwise largely clear. No significant mastoid effusion. Other: None. IMPRESSION: 1. Few small acute ischemic infarcts involving the left para median pontomedullary junction. No associated hemorrhage or mass effect. 2. Underlying age-related cerebral atrophy with chronic microvascular ischemic disease. Electronically Signed   By: Rise MuBenjamin  McClintock M.D.   On: 04/02/2022 02:56   MR BRAIN WO CONTRAST  Result Date: 03/31/2022 CLINICAL DATA:  Diplopia. EXAM: MRI HEAD WITHOUT CONTRAST  TECHNIQUE: Multiplanar, multiecho pulse sequences of the brain and surrounding structures were obtained without intravenous contrast. COMPARISON:  MRI of the head without contrast 01/23/2022 FINDINGS: Brain: No acute infarct, hemorrhage, or mass lesion is present. The ventricles are of normal size. Moderate atrophy and white matter changes are stable. The ventricles are of normal size. No significant extraaxial fluid collection is present. The internal auditory canals are within normal limits. The brainstem and cerebellum are within normal limits. Vascular: Flow is present in the major intracranial arteries. Skull and upper cervical spine: The craniocervical junction is normal. Upper cervical spine is within normal limits. Marrow signal is unremarkable. Sinuses/Orbits: Mild mucosal thickening is present the inferior right maxillary sinus. The paranasal sinuses and mastoid air cells are otherwise clear. The globes and orbits are within normal limits. IMPRESSION: 1. No acute intracranial abnormality or  significant interval change. 2. Stable moderate atrophy and white matter disease. This likely reflects the sequela of chronic microvascular ischemia. Electronically Signed   By: Marin Roberts M.D.   On: 03/31/2022 15:54   CT ANGIO HEAD NECK W WO CM  Result Date: 03/31/2022 CLINICAL DATA:  Diplopia EXAM: CT ANGIOGRAPHY HEAD AND NECK TECHNIQUE: Multidetector CT imaging of the head and neck was performed using the standard protocol during bolus administration of intravenous contrast. Multiplanar CT image reconstructions and MIPs were obtained to evaluate the vascular anatomy. Carotid stenosis measurements (when applicable) are obtained utilizing NASCET criteria, using the distal internal carotid diameter as the denominator. RADIATION DOSE REDUCTION: This exam was performed according to the departmental dose-optimization program which includes automated exposure control, adjustment of the mA and/or kV according to patient size and/or use of iterative reconstruction technique. CONTRAST:  21mL OMNIPAQUE IOHEXOL 350 MG/ML SOLN COMPARISON:  CT head and CTA head/neck May 5, 23. FINDINGS: CT HEAD FINDINGS Brain: No evidence of acute large vascular territory infarction, hemorrhage, hydrocephalus, extra-axial collection or mass lesion/mass effect. 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 Mild additional scattered white matter hypodensities, nonspecific but compatible with chronic microvascular ischemic disease. Vascular: Evaluate below. Skull: No acute fracture. Sinuses: Retention cyst and mucosal thickening in the inferior right maxillary sinus. Otherwise, sinuses are clear. Orbits: No acute finding. Review of the MIP images confirms the above findings CTA NECK FINDINGS Aortic arch: Great vessel origins are patent without significant stenosis. Right carotid system: No evidence of dissection, stenosis (50% or greater) or occlusion. Left carotid system: No evidence of dissection, stenosis (50% or greater)  or occlusion. Vertebral arteries: Left dominant. No evidence of dissection, stenosis (50% or greater) or occlusion. 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 Skeleton: Multilevel degenerative change, including bridging osteophytes. Other neck: No acute findings. Upper chest: Visualized lung apices are clear. Review of the MIP images confirms the above findings CTA HEAD FINDINGS Anterior circulation: Bilateral intracranial ICAs, MCAs, and ACAs are patent without proximal hemodynamically significant stenosis. Hypoplastic left A1 ACA, probably congenital. Partially azygos ACA, anatomic variant. Posterior circulation: Left dominant intradural vertebral artery. Bilateral intradural vertebral arteries, basilar artery and both posterior cerebral arteries are patent. Similar moderate stenosis of the basilar artery. Multifocal moderate to severe stenosis of the right P2 PCA. Venous sinuses: Limited assessment due to non arterial timing. Anatomic variants: Detailed above. Review of the MIP images confirms the above findings IMPRESSION: 1. No evidence of acute intracranial abnormality. 2. No emergent large vessel occlusion. 3. 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 4. Moderate basilar artery stenosis. Electronically Signed  By: Feliberto Harts M.D.   On: 03/31/2022 13:37    Procedures Procedures   Medications Ordered in ED Medications  clopidogrel (PLAVIX) tablet 75 mg (has no administration in time range)  aspirin EC tablet 81 mg (81 mg Oral Given 04/02/22 0343)  clopidogrel (PLAVIX) tablet 300 mg (300 mg Oral Given 04/02/22 0139)    ED Course/ Medical Decision Making/ A&P                           Medical Decision Making Amount and/or Complexity of Data Reviewed Labs: ordered. Radiology: ordered.  Risk Decision regarding hospitalization.   This patient presents to the ED for concern of diplopia, this involves an extensive number of treatment  options, and is a complaint that carries with it a high risk of complications and morbidity.  The differential diagnosis includes TIA, CVA, eye trauma, mass etc.   Co morbidities that complicate the patient evaluation Hyperlipidemia, TIA  Additional history obtained:  Additional history obtained from: Patient External records from outside source obtained and reviewed including: Yesterday ED physician note  EKG: EKG: normal sinus rhythm, nonspecific ST and T waves changes.   Lab Results: I personally ordered, reviewed, and interpreted labs. Pertinent results include: CBC with mildly elevated hemoglobin BMP within normal limits Glucose 123  Imaging Studies ordered:  I ordered imaging studies which included MRI.  I independently reviewed & interpreted imaging & am in agreement with radiology impression. Imaging shows: MRI brain shows few small acute ischemic infarct involving the left paramedian pontomedullary junction  Medications  I ordered medication including Plavix for stroke Reevaluation of the patient after medication shows that patient  N/A -I reviewed the patient's home medications and did not make adjustments. -I did not prescribe new home medications.  Tests Considered: No further testing at this time  Critical Interventions: Neurology consult  Consultations: I requested consultation with the neurologist, Dr. Amada Jupiter,  and discussed lab and imaging findings as well as pertinent plan - they recommend: MRI.  Consulted with hospitalist, Dr. Leafy Half, who recommends admission  SDH None identified  ED Course:  61 year old male who presents to the emergency department today for diplopia.  He was here yesterday for same complaint.  He has had no change in his symptoms since yesterday. 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  His right pupil is down and to the right.  On extraocular movements his left eye is unable to move laterally to the  left past midline.  Additionally he does not move laterally to the right.  He also has dysmetria of the left hand on finger-to-nose. Glucose is normal He has had no recent head trauma.  Findings are concerning for stroke or central lesion/mass..   Given that he was here yesterday for same complaint and had CT angio head and neck as well as MRI brain with negative results I opted to talk with neurology prior to work-up. 0119: Consulted with Dr. Amada Jupiter, neurology, will come evaluate the patient.  0125Amada Jupiter at bedside. Would like to repeat MRI. Concern for stroke not picked up on yesterday's imaging.   On new MRI, patient has multiple acute ischemic infarcts of the left paramedian pontomedullary junction.  Dr. Amada Jupiter has ordered Plavix and aspirin for the patient.  The patient will need to be admitted to medicine for stroke work-up.  I then spoke with Dr. Leafy Half who agrees to admit the patient.  After  consideration of the diagnostic results and the patients response to treatment, I feel that the patent would benefit from admission. The patient has been appropriately medically screened and/or stabilized in the ED.   Final Clinical Impression(s) / ED Diagnoses Final diagnoses:  Diplopia    Rx / DC Orders ED Discharge Orders     None         Cristopher Peru, PA-C 04/02/22 0426    Tilden Fossa, MD 04/02/22 [phone]

## 2022-04-02 NOTE — Assessment & Plan Note (Signed)
   Increasing patient's usual regimen of atorvastatin to 80 mg daily per neurology recommendations  Lipid panel ordered

## 2022-04-02 NOTE — Progress Notes (Addendum)
Progress Note   Patient: Dennis Vega SNK:[id number] DOB: Apr 30, 1961 DOA: 04/01/2022     0 DOS: the patient was seen and examined on 04/02/2022   Brief hospital course: 61 year old male with past medical history of hypertension, hyperlipidemia, history of alcohol abuse with delirium tremens who presents to [facility] emergency department with complaints of right facial droop loss of balance and visual changes.    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 Without associated hemorrhage or mass effect. Performing serial neurologic checks Monitoring patient on telemetry Initiating antiplatelet therapy including aspirin and plavix Per neurology recommendations increasing atorvastatin therapy to 80 mg daily Obtaining hemoglobin A1c and lipid panel Echocardiogram in the morning with bubble study PT, OT, SLP evaluation Permissive hypertension with as needed antihypertensives only to be given if blood pressure greater than 220/115 Neurology following in consultation, their assistance is appreciated.   Essential hypertension Permissive hypertension for now Holding home regimen of oral antihypertensives As needed intravenous intermittent is for markedly elevated blood pressure of greater than 220/115   Mixed hyperlipidemia Increasing patient's usual regimen of atorvastatin to 80 mg daily per neurology recommendations Lipid panel ordered  Alcohol abuse States he has now reduced his daily usage to 2 drinks daily No clinical evidence of withdrawal at this time Initiating CIWA protocol As needed benzodiazepines will be administered for evidence of withdrawal        Subjective:  No reported events overnight. Symptoms about the same not improving. No dysphagia, cough, N/V;   Physical Exam: Vitals:   04/01/22 1943 04/02/22 0128 04/02/22 0658 04/02/22  0935  BP: 120/82 (!) 159/78 113/71 140/75  Pulse: 75 87 73 61  Resp: 17 17 16 18   Temp: 98.8 F (37.1 C) 98 F (36.7 C)    TempSrc: Oral     SpO2: 98% 98% 99% 98%  Constitutional: Awake alert and oriented x3, no associated distress.   Skin: no rashes, no lesions, good skin turgor noted. Eyes: Pupils are equally reactive to light.  No evidence of scleral icterus or conjunctival pallor.  ENMT: Moist mucous membranes noted.  Posterior pharynx clear of any exudate or lesions.   Neck: normal, supple, no masses, no thyromegaly.  No evidence of jugular venous distension.   Respiratory: clear to auscultation bilaterally, no wheezing, no crackles. Normal respiratory effort. No accessory muscle use.  Cardiovascular: Regular rate and rhythm, no murmurs / rubs / gallops. No extremity edema. 2+ pedal pulses. No carotid bruits.  Chest:   Nontender without crepitus or deformity.   Back:   Nontender without crepitus or deformity. Abdomen: Abdomen is soft and nontender.  No evidence of intra-abdominal masses.  Positive bowel sounds noted in all quadrants.   Musculoskeletal: No joint deformity upper and lower extremities. Good ROM, no contractures. Normal muscle tone.  Neurologic:  Notable intranuclear ophthalmoplegia of the left eye.  Notable left facial droop.  Otherwise, cranial nerves are grossly intact.  Sensation intact.  Patient moving all 4 extremities spontaneously.  Patient is following all commands.  Patient is responsive to verbal stimuli.   Psychiatric: Patient exhibits depressed mood with flat affect.  Patient seems to possess insight as to their current situation.   Data Reviewed:  Results are pending, will review when available.  Family Communication: none available  Disposition: Status is: Observation The patient remains OBS appropriate and will d/c before 2 midnights.  Planned Discharge Destination:  Skilled nursing facility    Time spent: 30 minutes  Author: Thomas Hoff,  MD 04/02/2022 1:46 PM  For on call review [email or web address].

## 2022-04-02 NOTE — Assessment & Plan Note (Signed)
   States he has now reduced his daily usage to 2 drinks daily  No clinical evidence of withdrawal at this time  Initiating CIWA protocol  As needed benzodiazepines will be administered for evidence of withdrawal

## 2022-04-02 NOTE — Progress Notes (Signed)
STROKE TEAM PROGRESS NOTE   SUBJECTIVE (INTERVAL HISTORY) His RN is at the bedside.  Overall his condition is stable. He still has left facial droop and diplopia. 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 MRI now showed pontine infarct.    OBJECTIVE Temp:  [98 F (36.7 C)-98.8 F (37.1 C)] 98 F (36.7 C) (07/14 0128) Pulse Rate:  [61-87] 61 (07/14 0935) Resp:  [15-18] 18 (07/14 0935) BP: (113-159)/(71-85) 140/75 (07/14 0935) SpO2:  [98 %-100 %] 98 % (07/14 0935)  Recent Labs  Lab 04/02/22 0134  GLUCAP 123*   Recent Labs  Lab 03/31/22 1228 04/02/22 0152  NA 135 135  K 4.2 4.6  CL 95* 99  CO2  --  24  GLUCOSE 133* 127*  BUN 6 13  CREATININE 0.70 1.23  CALCIUM  --  9.6   No results for input(s): "AST", "ALT", "ALKPHOS", "BILITOT", "PROT", "ALBUMIN" in the last 168 hours. Recent Labs  Lab 03/31/22 1219 03/31/22 1228 04/02/22 0152  WBC 4.2  --  4.0  NEUTROABS 2.0  --  1.4*  HGB 17.2* 18.0* 18.0*  HCT 48.9 53.0* 51.1  MCV 99.0  --  99.0  PLT 187  --  PLATELET CLUMPS NOTED ON SMEAR, UNABLE TO ESTIMATE   No results for input(s): "CKTOTAL", "CKMB", "CKMBINDEX", "TROPONINI" in the last 168 hours. Recent Labs    04/02/22 0152  LABPROT 13.3  INR 1.0   No results for input(s): "COLORURINE", "LABSPEC", "PHURINE", "GLUCOSEU", "HGBUR", "BILIRUBINUR", "KETONESUR", "PROTEINUR", "UROBILINOGEN", "NITRITE", "LEUKOCYTESUR" in the last 72 hours.  Invalid input(s): "APPERANCEUR"     Component Value Date/Time   CHOL 202 (H) 04/02/2022 0152   TRIG 63 04/02/2022 0152   HDL 69 04/02/2022 0152   CHOLHDL 2.9 04/02/2022 0152   VLDL 13 04/02/2022 0152   LDLCALC 120 (H) 04/02/2022 0152   Lab Results  Component Value Date   HGBA1C 5.1 04/02/2022      Component Value Date/Time   LABOPIA NONE DETECTED 04/02/2022 0856   COCAINSCRNUR NONE DETECTED 04/02/2022 0856   LABBENZ NONE DETECTED 04/02/2022 0856   AMPHETMU NONE DETECTED 04/02/2022 0856   THCU NONE DETECTED 04/02/2022  0856   LABBARB NONE DETECTED 04/02/2022 0856    Recent Labs  Lab 04/02/22 0843  ETH <10    I have personally reviewed the radiological images below and agree with the radiology interpretations.  MR BRAIN WO CONTRAST  Result Date: 04/02/2022 CLINICAL DATA:  Initial evaluation for neuro deficit, stroke suspected, diplopia. EXAM: MRI HEAD WITHOUT CONTRAST TECHNIQUE: Multiplanar, multiecho pulse sequences of the brain and surrounding structures were obtained without intravenous contrast. COMPARISON:  Prior MRI from 03/31/2022. FINDINGS: Brain: Mild age-related cerebral atrophy. Patchy and confluent T2/FLAIR hyperintensity involving the periventricular and deep white matter both cerebral hemispheres, most consistent with chronic small vessel ischemic disease. Few small foci of restricted diffusion involving the left para median pontomedullary junction are seen, consistent with small acute ischemic infarcts (series 5, image 63). These were likely present on prior brain MRI in retrospect, although difficult to see at that time. No associated hemorrhage or mass effect. No other evidence for acute or subacute ischemia. Gray-white matter differentiation otherwise maintained. No other areas of chronic cortical infarction. No acute intracranial hemorrhage. Single chronic microhemorrhage noted at the right lentiform nucleus, of doubtful significance in isolation. No mass lesion, midline shift or mass effect. No hydrocephalus or extra-axial fluid collection. Pituitary gland and suprasellar region within normal limits. Vascular: Major intracranial vascular flow voids are maintained. Skull  and upper cervical spine: Craniocervical junction within normal limits. Bone marrow signal intensity diffusely heterogeneous without worrisome osseous lesion. No scalp soft tissue abnormality. Sinuses/Orbits: Globes and orbital soft tissues within normal limits. Small right maxillary sinus retention cyst. Paranasal sinuses are  otherwise largely clear. No significant mastoid effusion. Other: None. IMPRESSION: 1. Few small acute ischemic infarcts involving the left para median pontomedullary junction. No associated hemorrhage or mass effect. 2. Underlying age-related cerebral atrophy with chronic microvascular ischemic disease. Electronically Signed   By: Rise Mu M.D.   On: 04/02/2022 02:56   MR BRAIN WO CONTRAST  Result Date: 03/31/2022 CLINICAL DATA:  Diplopia. EXAM: MRI HEAD WITHOUT CONTRAST TECHNIQUE: Multiplanar, multiecho pulse sequences of the brain and surrounding structures were obtained without intravenous contrast. COMPARISON:  MRI of the head without contrast 01/23/2022 FINDINGS: Brain: No acute infarct, hemorrhage, or mass lesion is present. The ventricles are of normal size. Moderate atrophy and white matter changes are stable. The ventricles are of normal size. No significant extraaxial fluid collection is present. The internal auditory canals are within normal limits. The brainstem and cerebellum are within normal limits. Vascular: Flow is present in the major intracranial arteries. Skull and upper cervical spine: The craniocervical junction is normal. Upper cervical spine is within normal limits. Marrow signal is unremarkable. Sinuses/Orbits: Mild mucosal thickening is present the inferior right maxillary sinus. The paranasal sinuses and mastoid air cells are otherwise clear. The globes and orbits are within normal limits. IMPRESSION: 1. No acute intracranial abnormality or significant interval change. 2. Stable moderate atrophy and white matter disease. This likely reflects the sequela of chronic microvascular ischemia. Electronically Signed   By: Marin Roberts M.D.   On: 03/31/2022 15:54   CT ANGIO HEAD NECK W WO CM  Result Date: 03/31/2022 CLINICAL DATA:  Diplopia EXAM: CT ANGIOGRAPHY HEAD AND NECK TECHNIQUE: Multidetector CT imaging of the head and neck was performed using the standard  protocol during bolus administration of intravenous contrast. Multiplanar CT image reconstructions and MIPs were obtained to evaluate the vascular anatomy. Carotid stenosis measurements (when applicable) are obtained utilizing NASCET criteria, using the distal internal carotid diameter as the denominator. RADIATION DOSE REDUCTION: This exam was performed according to the departmental dose-optimization program which includes automated exposure control, adjustment of the mA and/or kV according to patient size and/or use of iterative reconstruction technique. CONTRAST:  80mL OMNIPAQUE IOHEXOL 350 MG/ML SOLN COMPARISON:  CT head and CTA head/neck May 5, 23. FINDINGS: CT HEAD FINDINGS Brain: No evidence of acute large vascular territory infarction, hemorrhage, hydrocephalus, extra-axial collection or mass lesion/mass effect. 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 Mild additional scattered white matter hypodensities, nonspecific but compatible with chronic microvascular ischemic disease. Vascular: Evaluate below. Skull: No acute fracture. Sinuses: Retention cyst and mucosal thickening in the inferior right maxillary sinus. Otherwise, sinuses are clear. Orbits: No acute finding. Review of the MIP images confirms the above findings CTA NECK FINDINGS Aortic arch: Great vessel origins are patent without significant stenosis. Right carotid system: No evidence of dissection, stenosis (50% or greater) or occlusion. Left carotid system: No evidence of dissection, stenosis (50% or greater) or occlusion. Vertebral arteries: Left dominant. No evidence of dissection, stenosis (50% or greater) or occlusion. 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 Skeleton: Multilevel degenerative change, including bridging osteophytes. Other neck: No acute findings. Upper chest: Visualized lung apices are clear. Review of the MIP images confirms the above findings CTA HEAD FINDINGS Anterior  circulation:  Bilateral intracranial ICAs, MCAs, and ACAs are patent without proximal hemodynamically significant stenosis. Hypoplastic left A1 ACA, probably congenital. Partially azygos ACA, anatomic variant. Posterior circulation: Left dominant intradural vertebral artery. Bilateral intradural vertebral arteries, basilar artery and both posterior cerebral arteries are patent. Similar moderate stenosis of the basilar artery. Multifocal moderate to severe stenosis of the right P2 PCA. Venous sinuses: Limited assessment due to non arterial timing. Anatomic variants: Detailed above. Review of the MIP images confirms the above findings IMPRESSION: 1. No evidence of acute intracranial abnormality. 2. No emergent large vessel occlusion. 3. 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 4. Moderate basilar artery stenosis. Electronically Signed   By: Feliberto Harts M.D.   On: 03/31/2022 13:37     PHYSICAL EXAM  Temp:  [98 F (36.7 C)-98.8 F (37.1 C)] 98 F (36.7 C) (07/14 0128) Pulse Rate:  [61-87] 61 (07/14 0935) Resp:  [15-18] 18 (07/14 0935) BP: (113-159)/(71-85) 140/75 (07/14 0935) SpO2:  [98 %-100 %] 98 % (07/14 0935)  General - Well nourished, well developed, in no apparent distress.  Ophthalmologic - fundi not visualized due to noncooperation.  Cardiovascular - Regular rhythm and rate.  Mental Status -  Level of arousal and orientation to time, place, and person were intact. Language including expression, naming, repetition, comprehension was assessed and found intact. Mild dysarthria Fund of Knowledge was assessed and was intact.  Cranial Nerves II - XII - II - Visual field intact OU. III, IV, VI - one and half syndrome with right eye abduction position at rest, b/l eyes left gaze palsy (right eye slight inward gaze), and left INO.  V - Facial sensation intact bilaterally. VII - left facial droop. Left palpebral fissure enlarged, decrease left eye closure and frown and frontal  crease VIII - Hearing & vestibular exam showed right eye abduction nystagmus. X - Palate elevates symmetrically. Mild dysarthria XI - Chin turning & shoulder shrug intact bilaterally. XII - Tongue protrusion intact.  Motor Strength - The patient's strength was normal in all extremities and pronator drift was absent.  Bulk was normal and fasciculations were absent.   Motor Tone - Muscle tone was assessed at the neck and appendages and was normal.  Reflexes - The patient's reflexes were symmetrical in all extremities and he had no pathological reflexes.  Sensory - Light touch, temperature/pinprick were assessed and were symmetrical.    Coordination - The patient had normal movements in the hands and feet with no ataxia or dysmetria.  Tremor was absent.  Gait and Station - deferred.   ASSESSMENT/PLAN Mr. Philip Kotlyar is a 61 y.o. male with history of hypertension and TIA admitted for diplopia, nystagmus and off-balance. No tPA given due to outside window.    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 MRI 03/31/2022 no acute abnormality MRI repeat 04/02/2022 left pontine infarct 2D Echo EF 55 to 60% LDL 120 HgbA1c 5.1 UDS negative Lovenox for VTE prophylaxis No antithrombotic prior to admission, now on aspirin 81 mg daily and clopidogrel 75 mg daily DAPT for 3 weeks and then aspirin alone. Patient counseled to be compliant with his antithrombotic medications Ongoing aggressive stroke risk factor management Therapy recommendations: Outpatient PT Disposition: Pending  History of TIA In 01/2022 patient admitted for dizziness and vision changes.  CT no acute abnormality.  CTA head and neck showed  moderate right P2 and basilar artery stenosis.  CTP negative.  MRI negative.  LDL 102, A1c 5.5.  Patient discharged with  aspirin 81 and Lipitor 40.   However, patient stated he is not taking any medication at home.  Hyperlipidemia Home meds: None LDL 120, goal < 70 Now on Lipitor 40 Continue statin at discharge  Other Stroke Risk Factors ETOH use, recommend no more than 2 drinks per day  Other Active Problems Creatinine 1.23  Hospital day # 0  Neurology will sign off. Please call with questions. Pt will follow up with stroke clinic NP at [facility] in about 4 weeks. Thanks for the consult.   Marvel Plan, MD PhD Stroke Neurology 04/02/2022 12:10 PM    To contact Stroke Continuity provider, please refer to [email or web address]. After hours, contact General Neurology

## 2022-04-02 NOTE — TOC Initial Note (Addendum)
Transition of Care [facility]) - Initial/Assessment Note    Patient Details  Name: Dennis Vega MRN: [id number] Date of Birth: 08/23/1961  [facility]) CM/SW Contact:    Kermit Balo, RN Phone Number: 04/02/2022, 4:15 PM  Clinical Narrative:                 Pt is from home with his cousins. He uses the bus or his case worker assists with transportation.  Pt uses [facility] for his PCP. [facility]. He most likely will need medication assist at d/c. CM has updated the MD that meds will need to go to [facility] outpatient pharmacy if d/c on Saturday. Pt may need a MATCH depending on cost. Pt will need assistance with transportation home if discharges over the weekend.  CM discussed outpatient vs home health with the patient and he prefers outpatient therapy. Referral to Neurorehab with information on the AVS.  TOC following.  Expected Discharge Plan: OP Rehab Barriers to Discharge: Continued Medical Work up   Patient Goals and CMS Choice     Choice offered to / list presented to : Patient  Expected Discharge Plan and Services Expected Discharge Plan: OP Rehab   Discharge Planning Services: CM Consult   Living arrangements for the past 2 months: Single Family Home                 DME Arranged: Walker rolling DME Agency: [employer] Date DME Agency Contacted: 04/02/22   Representative spoke with at DME Agency: Delorise Jackson            Prior Living Arrangements/Services Living arrangements for the past 2 months: Single Family Home Lives with:: Relatives Patient language and need for interpreter reviewed:: Yes Do you feel safe going back to the place where you live?: Yes            Criminal Activity/Legal Involvement Pertinent to Current Situation/Hospitalization: No - Comment as needed  Activities of Daily Living Home Assistive Devices/Equipment: None ADL Screening (condition at time of  admission) Patient's cognitive ability adequate to safely complete daily activities?: Yes Is the patient deaf or have difficulty hearing?: No Does the patient have difficulty seeing, even when wearing glasses/contacts?: No Does the patient have difficulty concentrating, remembering, or making decisions?: No Patient able to express need for assistance with ADLs?: Yes Does the patient have difficulty dressing or bathing?: No Independently performs ADLs?: Yes (appropriate for developmental age) Does the patient have difficulty walking or climbing stairs?: No Weakness of Legs: None Weakness of Arms/Hands: None  Permission Sought/Granted                  Emotional Assessment Appearance:: Appears stated age Attitude/Demeanor/Rapport: Engaged Affect (typically observed): Accepting Orientation: : Oriented to Self, Oriented to Place, Oriented to  Time, Oriented to Situation   Psych Involvement: No (comment)  Admission diagnosis:  Diplopia [H53.2] Acute stroke due to ischemia [facility]) [I63.9] Patient Active Problem List   Diagnosis Date Noted   Acute stroke due to ischemia (HCC) 04/02/2022   TIA (transient ischemic attack) 01/23/2022   Mixed hyperlipidemia 01/23/2022   Diplopia 01/22/2022   Thrombocytopenia (HCC)    Essential hypertension    Alcohol abuse    Transaminitis    Alcohol withdrawal (HCC) 04/06/2020   Delirium tremens (HCC) 04/06/2020   Mandible fracture (HCC) 06/24/2019   Fracture of mandible (HCC) 06/23/2019   PCP:  Lavinia Sharps, NP Pharmacy:  [id number] [place], [place] - [address] AT [facility] [address] & [facility] [address] [place] [address] Phone: [phone] Fax: [phone]  [facility] [address] [place] [address] Phone: [phone] Fax: [phone]     Social Determinants of Health (SDOH) Interventions    Readmission Risk Interventions     No data to display

## 2022-04-02 NOTE — Assessment & Plan Note (Signed)
   Permissive hypertension for now  Holding home regimen of oral antihypertensives  As needed intravenous intermittent is for markedly elevated blood pressure of greater than 220/115

## 2022-04-02 NOTE — Evaluation (Signed)
Occupational Therapy Evaluation Patient Details Name: Dennis Vega MRN: [id number] DOB: 08-25-1961 Today's Date: 04/02/2022   History of Present Illness Pt is a 61 y/o male admitted secondary to double vision, instability, and R facial droop. MRI showed L paramedian pontomedulary junction infarcts. PMH includes HTN and alcohol abuse.   Clinical Impression   Pt admitted for concerns listed above. PTA pt reported that he was independent with all ADL's and IADL's. At this time, pt presents with visual deficits, balance deficits, mild weakness, and decreased activity tolerance. Pt presents with diplopia and poor motor control of his R eye. His vision is affecting his balance and he is requiring up to min A for safety with functional mobility. Recommending OP OT to maximize independence and address vision deficits. OT will follow acutely.       Recommendations for follow up therapy are one component of a multi-disciplinary discharge planning process, led by the attending physician.  Recommendations may be updated based on patient status, additional functional criteria and insurance authorization.   Follow Up Recommendations  Outpatient OT    Assistance Recommended at Discharge Set up Supervision/Assistance  Patient can return home with the following A little help with walking and/or transfers;A little help with bathing/dressing/bathroom;Assistance with cooking/housework;Direct supervision/assist for medications management    Functional Status Assessment  Patient has had a recent decline in their functional status and demonstrates the ability to make significant improvements in function in a reasonable and predictable amount of time.  Equipment Recommendations  Other (comment) (TBD)    Recommendations for Other Services       Precautions / Restrictions Precautions Precautions: Fall Restrictions Weight Bearing Restrictions: No      Mobility Bed Mobility Overal bed mobility: Needs  Assistance Bed Mobility: Supine to Sit, Sit to Supine     Supine to sit: Supervision Sit to supine: Supervision   General bed mobility comments: supervision for safety    Transfers Overall transfer level: Needs assistance Equipment used: None Transfers: Sit to/from Stand Sit to Stand: Min guard           General transfer comment: Balance deficits in standing, most likely due to visual impairments      Balance Overall balance assessment: Mild deficits observed, not formally tested                                         ADL either performed or assessed with clinical judgement   ADL Overall ADL's : Needs assistance/impaired                                       General ADL Comments: Min guard to min A overall for ADL's due to visual deficits and balance deficits     Vision Baseline Vision/History: 0 No visual deficits Ability to See in Adequate Light: 0 Adequate Patient Visual Report: Diplopia;Blurring of vision Vision Assessment?: Yes Eye Alignment: Impaired (comment) (R eye gaze preferance to the R) Ocular Range of Motion: Restricted on the right Alignment/Gaze Preference: Other (comment) (R gaze to the R) Tracking/Visual Pursuits: Right eye does not track laterally;Right eye does not track medially Saccades: Impaired - to be further tested in functional context Convergence: Impaired (comment) (unable to test due to difficulty with R eye control) Visual Fields: Other (comment) (Impaired due to R eye deficits)  Diplopia Assessment: Present all the time/all directions;Objects split side to side     Perception     Praxis      Pertinent Vitals/Pain Pain Assessment Pain Assessment: No/denies pain     Hand Dominance Right   Extremity/Trunk Assessment Upper Extremity Assessment Upper Extremity Assessment: Generalized weakness   Lower Extremity Assessment Lower Extremity Assessment: Defer to PT evaluation RLE Deficits /  Details: grossly 3/5 at hip flexors and 4/5 at knee extensors.   Cervical / Trunk Assessment Cervical / Trunk Assessment: Normal   Communication Communication Communication: No difficulties   Cognition Arousal/Alertness: Awake/alert Behavior During Therapy: WFL for tasks assessed/performed Overall Cognitive Status: No family/caregiver present to determine baseline cognitive functioning                                       General Comments  VSS on RA    Exercises     Shoulder Instructions      Home Living Family/patient expects to be discharged to:: Private residence Living Arrangements: Other relatives (aunt and cousin) Available Help at Discharge: Family;Available PRN/intermittently Type of Home: House Home Access: Stairs to enter [employer] of Steps: 4 Entrance Stairs-Rails: Left;Right Home Layout: One level     Bathroom Shower/Tub: Chief Strategy Officer: Standard     Home Equipment: None          Prior Functioning/Environment Prior Level of Function : Independent/Modified Independent                        OT Problem List: Decreased strength;Decreased activity tolerance;Impaired balance (sitting and/or standing);Impaired vision/perception;Decreased coordination      OT Treatment/Interventions: Self-care/ADL training;Therapeutic exercise;Neuromuscular education;Energy conservation;DME and/or AE instruction;Therapeutic activities;Visual/perceptual remediation/compensation;Patient/family education;Balance training    OT Goals(Current goals can be found in the care plan section) Acute Rehab OT Goals Patient Stated Goal: To get back to his normal OT Goal Formulation: With patient Time For Goal Achievement: 04/16/22 Potential to Achieve Goals: Good ADL Goals Additional ADL Goal #1: Pt will complete a medication managment task with independence. Additional ADL Goal #2: Pt will locate items needed for different  grooming tasks on a "cluttered" sink. Additional ADL Goal #3: Pt will utilize diplopia glasses 100% of the time to lessen diplopia impairments.  OT Frequency: Min 2X/week    Co-evaluation              AM-PAC OT "6 Clicks" Daily Activity     Outcome Measure Help from another person eating meals?: A Little Help from another person taking care of personal grooming?: A Little Help from another person toileting, which includes using toliet, bedpan, or urinal?: A Little Help from another person bathing (including washing, rinsing, drying)?: A Little Help from another person to put on and taking off regular upper body clothing?: A Little Help from another person to put on and taking off regular lower body clothing?: A Little 6 Click Score: 18   End of Session Equipment Utilized During Treatment: Gait belt Nurse Communication: Mobility status  Activity Tolerance: Patient tolerated treatment well Patient left: in bed;with call bell/phone within reach  OT Visit Diagnosis: Unsteadiness on feet (R26.81);Muscle weakness (generalized) (M62.81);Low vision, both eyes (H54.2)                Time: 0175-1025 OT Time Calculation (min): 13 min Charges:  OT General Charges $OT Visit: 1 Visit  OT Evaluation $OT Eval Moderate Complexity: 1 Mod  Fatime Biswell H., OTR/L Acute Rehabilitation  Adaeze Better Elane Bing Plume 04/02/2022, 2:05 PM

## 2022-04-03 ENCOUNTER — Other Ambulatory Visit (HOSPITAL_COMMUNITY): Payer: Self-pay

## 2022-04-03 LAB — CBC WITH DIFFERENTIAL/PLATELET
Abs Immature Granulocytes: 0.01 10*3/uL (ref 0.00–0.07)
Basophils Absolute: 0.1 10*3/uL (ref 0.0–0.1)
Basophils Relative: 1 %
Eosinophils Absolute: 0.1 10*3/uL (ref 0.0–0.5)
Eosinophils Relative: 2 %
HCT: 45.7 % (ref 39.0–52.0)
Hemoglobin: 16.1 g/dL (ref 13.0–17.0)
Immature Granulocytes: 0 %
Lymphocytes Relative: 48 %
Lymphs Abs: 1.9 10*3/uL (ref 0.7–4.0)
MCH: 34.4 pg — ABNORMAL HIGH (ref 26.0–34.0)
MCHC: 35.2 g/dL (ref 30.0–36.0)
MCV: 97.6 fL (ref 80.0–100.0)
Monocytes Absolute: 0.8 10*3/uL (ref 0.1–1.0)
Monocytes Relative: 19 %
Neutro Abs: 1.2 10*3/uL — ABNORMAL LOW (ref 1.7–7.7)
Neutrophils Relative %: 30 %
Platelets: 196 10*3/uL (ref 150–400)
RBC: 4.68 MIL/uL (ref 4.22–5.81)
RDW: 12.4 % (ref 11.5–15.5)
WBC: 4 10*3/uL (ref 4.0–10.5)
nRBC: 0 % (ref 0.0–0.2)

## 2022-04-03 LAB — COMPREHENSIVE METABOLIC PANEL
ALT: 60 U/L — ABNORMAL HIGH (ref 0–44)
AST: 31 U/L (ref 15–41)
Albumin: 3.5 g/dL (ref 3.5–5.0)
Alkaline Phosphatase: 52 U/L (ref 38–126)
Anion gap: 8 (ref 5–15)
BUN: 11 mg/dL (ref 6–20)
CO2: 26 mmol/L (ref 22–32)
Calcium: 9.1 mg/dL (ref 8.9–10.3)
Chloride: 101 mmol/L (ref 98–111)
Creatinine, Ser: 0.94 mg/dL (ref 0.61–1.24)
GFR, Estimated: >60 mL/min (ref >60)
Glucose, Bld: 117 mg/dL — ABNORMAL HIGH (ref 70–99)
Potassium: 4.2 mmol/L (ref 3.5–5.1)
Sodium: 135 mmol/L (ref 135–145)
Total Bilirubin: 1.4 mg/dL — ABNORMAL HIGH (ref 0.3–1.2)
Total Protein: 6.4 g/dL — ABNORMAL LOW (ref 6.5–8.1)

## 2022-04-03 LAB — MAGNESIUM: Magnesium: 2.3 mg/dL (ref 1.7–2.4)

## 2022-04-03 MED ORDER — ASPIRIN 81 MG PO TBEC
81.0000 mg | DELAYED_RELEASE_TABLET | Freq: Every day | ORAL | 11 refills | Status: AC
  Filled 2022-04-03: qty 30, 30d supply, fill #0

## 2022-04-03 MED ORDER — CLOPIDOGREL BISULFATE 75 MG PO TABS
75.0000 mg | ORAL_TABLET | Freq: Every day | ORAL | 0 refills | Status: AC
  Filled 2022-04-03: qty 19, 19d supply, fill #0

## 2022-04-03 MED ORDER — ATORVASTATIN CALCIUM 80 MG PO TABS
80.0000 mg | ORAL_TABLET | Freq: Every day | ORAL | 11 refills | Status: AC
  Filled 2022-04-03: qty 30, 30d supply, fill #0

## 2022-04-03 NOTE — Discharge Summary (Signed)
Physician Discharge Summary  Dennis Vega DXI:[id number] DOB: 1960-11-27 DOA: 04/01/2022  PCP: Lavinia Sharps, NP  Admit date: 04/01/2022 Discharge date: 04/03/2022  Time spent: 40 minutes  Recommendations for Outpatient Follow-up:  Follow outpatient CBC/CMP  Follow neurology and outpatient therapy outpatient Follow with PCP outpatient DAPTx3 weeks, then aspirin alone  Follow BP outpatient   Discharge Diagnoses:  Principal Problem:   Acute stroke due to ischemia [facility]) Active Problems:   Essential hypertension   Mixed hyperlipidemia   Alcohol abuse   Discharge Condition: stable  Diet recommendation: heart healthy  There were no vitals filed for this visit.  History of present illness:  61 year old male with past medical history of hypertension, hyperlipidemia, history of alcohol abuse with delirium tremens who presents to [facility] emergency department with complaints of right facial droop loss of balance and visual changes.   He was admitted with Geza Beranek stroke.  Seen by neurology recommending aspirin/plavix x3 weeks, then aspirin alone.  Lipitor.  He'll need outpatient therapy.  See below for additional details  Hospital Course:  Assessment and Plan: * Acute stroke due to ischemia [facility]) MRI  7/14 brain reveals Nelia Rogoff few small acute ischemic infarcts involving the left para median pontomedullary junction. Without associated hemorrhage or mass effect. 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  Continue lipitor.   Essential hypertension Resume home amlodipine after discharge   Mixed hyperlipidemia Increasing patient's usual regimen of atorvastatin to 80 mg daily per neurology  recommendations   Alcohol abuse States he has now reduced his daily usage to 2 drinks daily No clinical evidence of withdrawal at this time Initiating CIWA protocol As needed benzodiazepines will be administered for evidence of withdrawal      Procedures: none   Consultations: neurology  Discharge Exam: Vitals:   04/03/22 0352 04/03/22 0736  BP: (!) 150/99 (!) 142/99  Pulse: 63 67  Resp: 20 20  Temp: 97.9 F (36.6 C) 98.1 F (36.7 C)  SpO2: 100% 100%   NAD  General: No acute distress. Cardiovascular: RRR Lungs: unlabored Abdomen: Soft, nontender, nondistended  Neurological: L sided facial droop, L INO  Extremities: No clubbing or cyanosis. No edema.  Discharge Instructions   Discharge Instructions     Ambulatory referral to Neurology   Complete by: As directed    Follow up with stroke clinic NP (Jessica Vanschaick or Darrol Angel, if both not available, consider Manson Allan, or Ahern) at [facility] in about 4 weeks. Thanks.   Ambulatory referral to Occupational Therapy   Complete by: As directed    Ambulatory referral to Physical Therapy   Complete by: As directed    Call MD for:  difficulty breathing, headache or visual disturbances   Complete by: As directed    Call MD for:  extreme fatigue   Complete by: As directed    Call MD for:  hives   Complete by: As directed    Call MD for:  persistant dizziness or light-headedness   Complete by: As directed    Call MD for:  persistant nausea and vomiting   Complete by: As directed    Call MD for:  redness, tenderness, or signs of infection (pain, swelling, redness, odor or green/yellow discharge around incision site)   Complete  by: As directed    Call MD for:  severe uncontrolled pain   Complete by: As directed    Call MD for:  temperature >100.4   Complete by: As directed    Diet - low sodium heart healthy   Complete by: As directed    Discharge instructions   Complete by: As directed    You were seen  for Ryett Hamman stroke.  We're treating you with aspirin and plavix for 21 days (you've already had 2 days, you've been prescribed 19 more days of plavix), then you'll take aspirin alone.  We've prescribed you with lipitor 80 mg daily for your cholesterol and stroke prevention.  You've been seen by neurology.  They're recommending outpatient follow up.  You need outpatient rehab services.  It's extremely important that you follow up with your PCP regarding this hospitalization and your stroke.  Return for new, recurrent, or worsening symptoms.  Please ask your PCP to request records from this hospitalization so they know what was done and what the next steps will be.   Increase activity slowly   Complete by: As directed       Allergies as of 04/03/2022   No Known Allergies      Medication List     TAKE these medications    amLODipine 10 MG tablet Commonly known as: NORVASC Take 10 mg by mouth daily. What changed: Another medication with the same name was removed. Continue taking this medication, and follow the directions you see here.   aspirin EC 81 MG tablet Take 1 tablet (81 mg total) by mouth daily. Swallow whole.   atorvastatin 80 MG tablet Commonly known as: LIPITOR Take 1 tablet (80 mg total) by mouth daily. What changed:  medication strength how much to take   clopidogrel 75 MG tablet Commonly known as: PLAVIX Take 1 tablet (75 mg total) by mouth daily for 19 days. Start taking on: April 04, 2022       No Known Allergies  Follow-up Information     [facility]. Schedule an appointment as soon as possible for Kioni Stahl visit in 1 week(s).   Specialty: Rehabilitation Contact information: [address] [id number] mc [place] [address] [phone]        [facility]. Schedule an appointment as soon as possible for Brailyn Killion visit in 1 month(s).   Specialty: Neurology Why: stroke clinic Contact  information: [address] [place] [address] [phone]                 The results of significant diagnostics from this hospitalization (including imaging, microbiology, ancillary and laboratory) are listed below for reference.    Significant Diagnostic Studies: MR BRAIN WO CONTRAST  Result Date: 04/02/2022 CLINICAL DATA:  Initial evaluation for neuro deficit, stroke suspected, diplopia. EXAM: MRI HEAD WITHOUT CONTRAST TECHNIQUE: Multiplanar, multiecho pulse sequences of the brain and surrounding structures were obtained without intravenous contrast. COMPARISON:  Prior MRI from 03/31/2022. FINDINGS: Brain: Mild age-related cerebral atrophy. Patchy and confluent T2/FLAIR hyperintensity involving the periventricular and deep white matter both cerebral hemispheres, most consistent with chronic small vessel ischemic disease. Few small foci of restricted diffusion involving the left para median pontomedullary junction are seen, consistent with small acute ischemic infarcts (series 5, image 63). These were likely present on prior brain MRI in retrospect, although difficult to see at that time. No associated hemorrhage or mass effect. No other evidence for acute or subacute ischemia. Gray-white matter  differentiation otherwise maintained. No other areas of chronic cortical infarction. No acute intracranial hemorrhage. Single chronic microhemorrhage noted at the right lentiform nucleus, of doubtful significance in isolation. No mass lesion, midline shift or mass effect. No hydrocephalus or extra-axial fluid collection. Pituitary gland and suprasellar region within normal limits. Vascular: Major intracranial vascular flow voids are maintained. Skull and upper cervical spine: Craniocervical junction within normal limits. Bone marrow signal intensity diffusely heterogeneous without worrisome osseous lesion. No scalp soft tissue abnormality. Sinuses/Orbits: Globes and orbital  soft tissues within normal limits. Small right maxillary sinus retention cyst. Paranasal sinuses are otherwise largely clear. No significant mastoid effusion. Other: None. IMPRESSION: 1. Few small acute ischemic infarcts involving the left para median pontomedullary junction. No associated hemorrhage or mass effect. 2. Underlying age-related cerebral atrophy with chronic microvascular ischemic disease. Electronically Signed   By: Rise Mu M.D.   On: 04/02/2022 02:56   MR BRAIN WO CONTRAST  Result Date: 03/31/2022 CLINICAL DATA:  Diplopia. EXAM: MRI HEAD WITHOUT CONTRAST TECHNIQUE: Multiplanar, multiecho pulse sequences of the brain and surrounding structures were obtained without intravenous contrast. COMPARISON:  MRI of the head without contrast 01/23/2022 FINDINGS: Brain: No acute infarct, hemorrhage, or mass lesion is present. The ventricles are of normal size. Moderate atrophy and white matter changes are stable. The ventricles are of normal size. No significant extraaxial fluid collection is present. The internal auditory canals are within normal limits. The brainstem and cerebellum are within normal limits. Vascular: Flow is present in the major intracranial arteries. Skull and upper cervical spine: The craniocervical junction is normal. Upper cervical spine is within normal limits. Marrow signal is unremarkable. Sinuses/Orbits: Mild mucosal thickening is present the inferior right maxillary sinus. The paranasal sinuses and mastoid air cells are otherwise clear. The globes and orbits are within normal limits. IMPRESSION: 1. No acute intracranial abnormality or significant interval change. 2. Stable moderate atrophy and white matter disease. This likely reflects the sequela of chronic microvascular ischemia. Electronically Signed   By: Marin Roberts M.D.   On: 03/31/2022 15:54   CT ANGIO HEAD NECK W WO CM  Result Date: 03/31/2022 CLINICAL DATA:  Diplopia EXAM: CT ANGIOGRAPHY HEAD AND  NECK TECHNIQUE: Multidetector CT imaging of the head and neck was performed using the standard protocol during bolus administration of intravenous contrast. Multiplanar CT image reconstructions and MIPs were obtained to evaluate the vascular anatomy. Carotid stenosis measurements (when applicable) are obtained utilizing NASCET criteria, using the distal internal carotid diameter as the denominator. RADIATION DOSE REDUCTION: This exam was performed according to the departmental dose-optimization program which includes automated exposure control, adjustment of the mA and/or kV according to patient size and/or use of iterative reconstruction technique. CONTRAST:  59mL OMNIPAQUE IOHEXOL 350 MG/ML SOLN COMPARISON:  CT head and CTA head/neck May 5, 23. FINDINGS: CT HEAD FINDINGS Brain: No evidence of acute large vascular territory infarction, hemorrhage, hydrocephalus, extra-axial collection or mass lesion/mass effect. 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 Mild additional scattered white matter hypodensities, nonspecific but compatible with chronic microvascular ischemic disease. Vascular: Evaluate below. Skull: No acute fracture. Sinuses: Retention cyst and mucosal thickening in the inferior right maxillary sinus. Otherwise, sinuses are clear. Orbits: No acute finding. Review of the MIP images confirms the above findings CTA NECK FINDINGS Aortic arch: Great vessel origins are patent without significant stenosis. Right carotid system: No evidence of dissection, stenosis (50% or greater) or occlusion. Left carotid system: No evidence of dissection, stenosis (50% or greater) or occlusion.  Vertebral arteries: Left dominant. No evidence of dissection, stenosis (50% or greater) or occlusion. 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 Skeleton: Multilevel degenerative change, including bridging osteophytes. Other neck: No acute findings. Upper chest: Visualized lung  apices are clear. Review of the MIP images confirms the above findings CTA HEAD FINDINGS Anterior circulation: Bilateral intracranial ICAs, MCAs, and ACAs are patent without proximal hemodynamically significant stenosis. Hypoplastic left A1 ACA, probably congenital. Partially azygos ACA, anatomic variant. Posterior circulation: Left dominant intradural vertebral artery. Bilateral intradural vertebral arteries, basilar artery and both posterior cerebral arteries are patent. Similar moderate stenosis of the basilar artery. Multifocal moderate to severe stenosis of the right P2 PCA. Venous sinuses: Limited assessment due to non arterial timing. Anatomic variants: Detailed above. Review of the MIP images confirms the above findings IMPRESSION: 1. No evidence of acute intracranial abnormality. 2. No emergent large vessel occlusion. 3. 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 4. Moderate basilar artery stenosis. Electronically Signed   By: Feliberto Harts M.D.   On: 03/31/2022 13:37    Microbiology: No results found for this or any previous visit (from the past 240 hour(s)).   Labs: Basic Metabolic Panel: Recent Labs  Lab 03/31/22 1228 04/02/22 0152 04/03/22 0647  NA 135 135 135  K 4.2 4.6 4.2  CL 95* 99 101  CO2  --  24 26  GLUCOSE 133* 127* 117*  BUN 6 13 11   CREATININE 0.70 1.23 0.94  CALCIUM  --  9.6 9.1  MG  --   --  2.3   Liver Function Tests: Recent Labs  Lab 04/03/22 0647  AST 31  ALT 60*  ALKPHOS 52  BILITOT 1.4*  PROT 6.4*  ALBUMIN 3.5   No results for input(s): "LIPASE", "AMYLASE" in the last 168 hours. No results for input(s): "AMMONIA" in the last 168 hours. CBC: Recent Labs  Lab 03/31/22 1219 03/31/22 1228 04/02/22 0152 04/03/22 0647  WBC 4.2  --  4.0 4.0  NEUTROABS 2.0  --  1.4* 1.2*  HGB 17.2* 18.0* 18.0* 16.1  HCT 48.9 53.0* 51.1 45.7  MCV 99.0  --  99.0 97.6  PLT 187  --  PLATELET CLUMPS NOTED ON SMEAR, UNABLE TO ESTIMATE 196   Cardiac  Enzymes: No results for input(s): "CKTOTAL", "CKMB", "CKMBINDEX", "TROPONINI" in the last 168 hours. BNP: BNP (last 3 results) No results for input(s): "BNP" in the last 8760 hours.  ProBNP (last 3 results) No results for input(s): "PROBNP" in the last 8760 hours.  CBG: Recent Labs  Lab 04/02/22 0134  GLUCAP 123*       Signed:  04/04/22 MD.  [facility] 04/03/2022, 11:51 AM

## 2022-04-03 NOTE — Progress Notes (Signed)
Occupational Therapy Treatment Patient Details Name: Dennis Vega MRN: [id number] DOB: Dec 30, 1960 Today's Date: 04/03/2022   History of present illness Pt is a 61 y/o male admitted secondary to double vision, instability, and R facial droop. MRI showed L paramedian pontomedulary junction infarcts. PMH includes HTN and alcohol abuse.   OT comments  Pt educated and was shown on how to use glasses to compensate for double vision to increase in balance for mobility as pt reports that they will have to cross over train tracks to go to and from the bus. Pt at this time had 4ww administered to pt and was shown on how to use at this time as reported they were not sure on how to operate at this time. Pt also was further educated on how to progress onto coordination with tasks due to changes in vision. Will continue to follow as needed.     Recommendations for follow up therapy are one component of a multi-disciplinary discharge planning process, led by the attending physician.  Recommendations may be updated based on patient status, additional functional criteria and insurance authorization.    Follow Up Recommendations  Outpatient OT    Assistance Recommended at Discharge Set up Supervision/Assistance  Patient can return home with the following  A little help with walking and/or transfers;A little help with bathing/dressing/bathroom;Assistance with cooking/housework;Direct supervision/assist for medications management   Equipment Recommendations  Other (comment) (4ww)    Recommendations for Other Services      Precautions / Restrictions Precautions Precautions: Fall Restrictions Weight Bearing Restrictions: No       Mobility Bed Mobility               General bed mobility comments: presented fully dressed sitting at EOB    Transfers Overall transfer level: Needs assistance Equipment used: None Transfers: Sit to/from Stand Sit to Stand: Supervision           General  transfer comment: due to vision educated about compensations     Balance Overall balance assessment: Mild deficits observed, not formally tested Sitting-balance support: No upper extremity supported, Feet supported Sitting balance-Leahy Scale: Good     Standing balance support: No upper extremity supported Standing balance-Leahy Scale: Fair Standing balance comment: able to maintain static standing without LOB                           ADL either performed or assessed with clinical judgement   ADL Overall ADL's : Needs assistance/impaired Eating/Feeding: Set up;Sitting   Grooming: Wash/dry hands;Wash/dry face;Min guard;Sitting   Upper Body Bathing: Supervision/ safety;Sitting   Lower Body Bathing: Min guard;Cueing for safety;Cueing for sequencing;Sit to/from stand   Upper Body Dressing : Min guard;Sitting;Cueing for safety;Cueing for sequencing                          Extremity/Trunk Assessment Upper Extremity Assessment Upper Extremity Assessment: Generalized weakness   Lower Extremity Assessment Lower Extremity Assessment: Defer to PT evaluation        Vision   Vision Assessment?: Yes Eye Alignment: Impaired (comment) Alignment/Gaze Preference: Gaze right Saccades: Impaired - to be further tested in functional context Convergence: Impaired - to be further tested in functional context Visual Fields:  (decrease in R eye) Diplopia Assessment: Present all the time/all directions;Objects split side to side Depth Perception: Overshoots;Undershoots   Perception     Praxis      Cognition Arousal/Alertness: Awake/alert Behavior During  Therapy: WFL for tasks assessed/performed Overall Cognitive Status: No family/caregiver present to determine baseline cognitive functioning                                          Exercises      Shoulder Instructions       General Comments      Pertinent Vitals/ Pain       Pain  Assessment Pain Assessment: No/denies pain  Home Living                                          Prior Functioning/Environment              Frequency  Min 2X/week        Progress Toward Goals  OT Goals(current goals can now be found in the care plan section)  Progress towards OT goals: Progressing toward goals  Acute Rehab OT Goals Patient Stated Goal: To get home OT Goal Formulation: With patient Time For Goal Achievement: 04/16/22 Potential to Achieve Goals: Good ADL Goals Additional ADL Goal #1: Pt will complete a medication managment task with independence. Additional ADL Goal #2: Pt will locate items needed for different grooming tasks on a "cluttered" sink. Additional ADL Goal #3: Pt will utilize diplopia glasses 100% of the time to lessen diplopia impairments.  Plan Discharge plan remains appropriate    Co-evaluation                 AM-PAC OT "6 Clicks" Daily Activity     Outcome Measure   Help from another person eating meals?: A Little Help from another person taking care of personal grooming?: A Little Help from another person toileting, which includes using toliet, bedpan, or urinal?: A Little Help from another person bathing (including washing, rinsing, drying)?: A Little Help from another person to put on and taking off regular upper body clothing?: A Little Help from another person to put on and taking off regular lower body clothing?: A Little 6 Click Score: 18    End of Session Equipment Utilized During Treatment: Gait belt;Rollator (4 wheels)  OT Visit Diagnosis: Unsteadiness on feet (R26.81);Muscle weakness (generalized) (M62.81);Low vision, both eyes (H54.2)   Activity Tolerance Patient tolerated treatment well   Patient Left in bed;with call bell/phone within reach   Nurse Communication  (Pt waiing to be dc from the hospital)        Time: 2836-6294 OT Time Calculation (min): 15 min  Charges: OT General  Charges $OT Visit: 1 Visit OT Treatments $Self Care/Home Management : 8-22 mins  Alphia Moh OTR/L  Acute Rehab Services  [phone] office number [phone] pager number   Alphia Moh 04/03/2022, 2:54 PM

## 2022-04-03 NOTE — TOC Transition Note (Signed)
Transition of Care [facility]) - CM/SW Discharge Note   Patient Details  Name: Dennis Vega MRN: [id number] Date of Birth: 04/12/1961  [facility] of [facility] CM/SW Contact:  Lawerance Sabal, RN Phone Number: 04/03/2022, 12:01 PM   Clinical Narrative:    Overrode MATCH to facilitate Weekend DC. Discussed transportation, patient is very familiar with bus routes and will require 4 passes to get to pharmacy and back home, these will be tubed to the unit. Rollator ordered through [employer] to the room. Active w [facility].  No other TOC needs   Final next level of care: Home/Self Care Barriers to Discharge: No Barriers Identified   Patient Goals and CMS Choice     Choice offered to / list presented to : Patient  Discharge Placement                       Discharge Plan and Services   Discharge Planning Services: CM Consult            DME Arranged: Walker rolling with seat DME Agency: [employer] Date DME Agency Contacted: 04/03/22 Time DME Agency Contacted: 1201 Representative spoke with at DME Agency: Silvio Pate            Social Determinants of Health (SDOH) Interventions     Readmission Risk Interventions     No data to display

## 2022-04-09 ENCOUNTER — Other Ambulatory Visit (HOSPITAL_COMMUNITY): Payer: Self-pay

## 2022-04-12 ENCOUNTER — Other Ambulatory Visit (HOSPITAL_COMMUNITY): Payer: Self-pay

## 2023-03-16 ENCOUNTER — Emergency Department (HOSPITAL_COMMUNITY)
Admission: EM | Admit: 2023-03-16 | Discharge: 2023-03-16 | Disposition: A | Discharge status: Home / Self Care | Payer: Self-pay | Attending: Emergency Medicine | Admitting: Emergency Medicine

## 2023-03-16 ENCOUNTER — Emergency Department (HOSPITAL_COMMUNITY): Payer: Self-pay

## 2023-03-16 DIAGNOSIS — Z7982 Long term (current) use of aspirin: Secondary | ICD-10-CM | POA: Insufficient documentation

## 2023-03-16 DIAGNOSIS — Y908 Blood alcohol level of 240 mg/100 ml or more: Secondary | ICD-10-CM | POA: Insufficient documentation

## 2023-03-16 DIAGNOSIS — F10920 Alcohol use, unspecified with intoxication, uncomplicated: Secondary | ICD-10-CM | POA: Insufficient documentation

## 2023-03-16 LAB — COMPREHENSIVE METABOLIC PANEL
ALT: 46 U/L — ABNORMAL HIGH (ref 0–44)
AST: 86 U/L — ABNORMAL HIGH (ref 15–41)
Albumin: 3.8 g/dL (ref 3.5–5.0)
Alkaline Phosphatase: 57 U/L (ref 38–126)
Anion gap: 10 (ref 5–15)
BUN: 8 mg/dL (ref 8–23)
CO2: 24 mmol/L (ref 22–32)
Calcium: 8.4 mg/dL — ABNORMAL LOW (ref 8.9–10.3)
Chloride: 104 mmol/L (ref 98–111)
Creatinine, Ser: 0.72 mg/dL (ref 0.61–1.24)
GFR, Estimated: >60 mL/min (ref >60)
Glucose, Bld: 118 mg/dL — ABNORMAL HIGH (ref 70–99)
Potassium: 3.5 mmol/L (ref 3.5–5.1)
Sodium: 138 mmol/L (ref 135–145)
Total Bilirubin: 1.5 mg/dL — ABNORMAL HIGH (ref 0.3–1.2)
Total Protein: 7.1 g/dL (ref 6.5–8.1)

## 2023-03-16 LAB — TROPONIN I (HIGH SENSITIVITY)
Troponin I (High Sensitivity): 6 ng/L (ref <18)
Troponin I (High Sensitivity): 6 ng/L (ref <18)

## 2023-03-16 LAB — CBC WITH DIFFERENTIAL/PLATELET
Abs Immature Granulocytes: 0.01 10*3/uL (ref 0.00–0.07)
Basophils Absolute: 0 10*3/uL (ref 0.0–0.1)
Basophils Relative: 1 %
Eosinophils Absolute: 0.1 10*3/uL (ref 0.0–0.5)
Eosinophils Relative: 2 %
HCT: 42.8 % (ref 39.0–52.0)
Hemoglobin: 14.5 g/dL (ref 13.0–17.0)
Immature Granulocytes: 0 %
Lymphocytes Relative: 52 %
Lymphs Abs: 1.7 10*3/uL (ref 0.7–4.0)
MCH: 35.3 pg — ABNORMAL HIGH (ref 26.0–34.0)
MCHC: 33.9 g/dL (ref 30.0–36.0)
MCV: 104.1 fL — ABNORMAL HIGH (ref 80.0–100.0)
Monocytes Absolute: 0.4 10*3/uL (ref 0.1–1.0)
Monocytes Relative: 12 %
Neutro Abs: 1.1 10*3/uL — ABNORMAL LOW (ref 1.7–7.7)
Neutrophils Relative %: 33 %
Platelets: 129 10*3/uL — ABNORMAL LOW (ref 150–400)
RBC: 4.11 MIL/uL — ABNORMAL LOW (ref 4.22–5.81)
RDW: 12.1 % (ref 11.5–15.5)
WBC: 3.3 10*3/uL — ABNORMAL LOW (ref 4.0–10.5)
nRBC: 0 % (ref 0.0–0.2)

## 2023-03-16 LAB — CBG MONITORING, ED: Glucose-Capillary: 107 mg/dL — ABNORMAL HIGH (ref 70–99)

## 2023-03-16 LAB — ETHANOL: Alcohol, Ethyl (B): 341 mg/dL (ref <10)

## 2023-03-16 LAB — D-DIMER, QUANTITATIVE: D-Dimer, Quant: 0.54 ug/mL-FEU — ABNORMAL HIGH (ref 0.00–0.50)

## 2023-03-16 MED ORDER — SODIUM CHLORIDE (PF) 0.9 % IJ SOLN
INTRAMUSCULAR | Status: AC
  Filled 2023-03-16: qty 50

## 2023-03-16 MED ORDER — IOHEXOL 350 MG/ML SOLN
100.0000 mL | Freq: Once | INTRAVENOUS | Status: AC | PRN
Start: 2023-03-16 — End: 2023-03-16
  Administered 2023-03-16: 100 mL via INTRAVENOUS

## 2023-03-16 NOTE — ED Provider Notes (Signed)
[place] EMERGENCY DEPARTMENT AT [facility] Provider Note   CSN: [id number] Arrival date & time: 03/16/23  0036     History  Chief Complaint  Patient presents with   Dizziness    Pt BIB [facility], pt with ETOH use tonight c/o dizziness , [place], pt ambulatory to ED stretcher w/o assistance     Dennis Vega is a 62 y.o. male.  The history is provided by the patient and medical records.  Dizziness Dennis Vega is a 62 y.o. male who presents to the Emergency Department complaining of syncope.  He presents the emergency department for what he reports is a passing out episode where he did not hurt himself.  He does not recall what happened but reports waking up on the ground.  According to EMS report he was [place] the bus station complaining of dizziness.  Patient denies any chest pain, difficulty breathing.  He does report occasional palpitations.  No recent illnesses.  He does report having 1 drink tonight.   Hx/o HTN - not currently on meds.     Home Medications Prior to Admission medications   Medication Sig Start Date End Date Taking? Authorizing Provider  amLODipine (NORVASC) 10 MG tablet Take 10 mg by mouth daily. 02/23/22   [provider]  aspirin EC 81 MG tablet Take 1 tablet (81 mg total) by mouth daily. Swallow whole. 04/03/22 03/29/23  Zigmund Daniel., MD  atorvastatin (LIPITOR) 80 MG tablet Take 1 tablet (80 mg total) by mouth daily. 04/03/22 03/29/23  Zigmund Daniel., MD      Allergies    Patient has no known allergies.    Review of Systems   Review of Systems  Neurological:  Positive for dizziness.  All other systems reviewed and are negative.   Physical Exam Updated Vital Signs BP 126/82   Pulse 75   Temp 97.7 F (36.5 C) (Oral)   Resp 17   Ht 5\' 9"  (1.753 m)   Wt 81.6 kg   SpO2 94%   BMI 26.58 kg/m  Physical Exam Vitals and nursing note reviewed.  Constitutional:      Appearance: He is  well-developed.     Comments: Smells of EtOH  HENT:     Head: Normocephalic and atraumatic.  Cardiovascular:     Rate and Rhythm: Normal rate and regular rhythm.     Heart sounds: No murmur heard. Pulmonary:     Effort: Pulmonary effort is normal. No respiratory distress.     Breath sounds: Normal breath sounds.  Abdominal:     Palpations: Abdomen is soft.     Tenderness: There is no abdominal tenderness. There is no guarding or rebound.  Musculoskeletal:        General: No tenderness.  Skin:    General: Skin is warm and dry.  Neurological:     Mental Status: He is alert and oriented to person, place, and time.     Comments: Appears intoxicated.  No asymmetry of facial movements.  5/5 strength in all four extremities.   Psychiatric:        Behavior: Behavior normal.     ED Results / Procedures / Treatments   Labs (all labs ordered are listed, but only abnormal results are displayed) Labs Reviewed  COMPREHENSIVE METABOLIC PANEL - Abnormal; Notable for the following components:      Result Value   Glucose, Bld 118 (*)    Calcium 8.4 (*)  AST 86 (*)    ALT 46 (*)    Total Bilirubin 1.5 (*)    All other components within normal limits  ETHANOL - Abnormal; Notable for the following components:   Alcohol, Ethyl (B) 341 (*)    All other components within normal limits  CBC WITH DIFFERENTIAL/PLATELET - Abnormal; Notable for the following components:   WBC 3.3 (*)    RBC 4.11 (*)    MCV 104.1 (*)    MCH 35.3 (*)    Platelets 129 (*)    Neutro Abs 1.1 (*)    All other components within normal limits  D-DIMER, QUANTITATIVE - Abnormal; Notable for the following components:   D-Dimer, Quant 0.54 (*)    All other components within normal limits  CBG MONITORING, ED - Abnormal; Notable for the following components:   Glucose-Capillary 107 (*)    All other components within normal limits  TROPONIN I (HIGH SENSITIVITY)  TROPONIN I (HIGH SENSITIVITY)    EKG EKG  Interpretation  Date/Time:  Wednesday March 16 2023 00:46:59 EDT Ventricular Rate:  86 PR Interval:  151 QRS Duration: 104 QT Interval:  396 QTC Calculation: 474 R Axis:   45 Text Interpretation: Sinus rhythm Confirmed by Tilden Fossa [phone]) on 03/16/2023 1:50:42 AM  Radiology CT Angio Chest PE W/Cm &/Or Wo Cm  Result Date: 03/16/2023 CLINICAL DATA:  Pulmonary embolism suspected with low to intermediate probability and a positive D-dimer. EXAM: CT ANGIOGRAPHY CHEST WITH CONTRAST TECHNIQUE: Multidetector CT imaging of the chest was performed using the standard protocol during bolus administration of intravenous contrast. Multiplanar CT image reconstructions and MIPs were obtained to evaluate the vascular anatomy. RADIATION DOSE REDUCTION: This exam was performed according to the departmental dose-optimization program which includes automated exposure control, adjustment of the mA and/or kV according to patient size and/or use of iterative reconstruction technique. CONTRAST:  OMNIPAQUE IOHEXOL 350 MG/ML SOLN COMPARISON:  Portable chest today, PA and lateral chest 06/23/2019, and CTA chest 08/26/2015 FINDINGS: Cardiovascular: There is mild-to-moderate panchamber cardiomegaly. No pericardial effusion is seen. There are single-vessel scattered calcific plaques in the LAD coronary artery. The pulmonary trunk is upper normal in caliber at 26 mm, was previously 21 mm. No arterial embolism is seen or disproportionate right chamber enlargement but there is mild IVC and hepatic vein reflux which may be seen with elevated right heart pressures as well as with tricuspid regurgitation. The pulmonary veins are mildly prominent. The great vessels and aorta are unremarkable. Mediastinum/Nodes: No enlarged mediastinal, hilar, or axillary lymph nodes. Thyroid gland, trachea, and esophagus demonstrate no significant findings. Lungs/Pleura: There is diffuse bronchial thickening. No bronchiectasis or plugging are  seen. No pulmonary edema is evident. There is no pleural effusion, thickening or pneumothorax. There is mild chronic elevation of the right hemidiaphragm. There are posterior atelectatic changes without evidence of infiltrates or suspicious nodules. A chronic 5 mm fissural nodule is again noted in the left mid lung on 12:59 and is probably an intrapulmonary lymph node, stable. Upper Abdomen: No acute abnormality.  There is hepatic steatosis. Musculoskeletal: There is extensive thoracic spine bridging enthesopathy consistent with DISH. No acute or other significant osseous findings or aggressive lesions. Review of the MIP images confirms the above findings. IMPRESSION: 1. No evidence of arterial embolus. 2. Upper normal pulmonary trunk caliber, with mild IVC and hepatic vein reflux which may be seen with elevated right heart pressures as well as with tricuspid regurgitation. 3. Cardiomegaly with single-vessel calcific CAD in the LAD and  distended central pulmonary veins. 4. Bronchitis without evidence of pneumonia. 5. Hepatic steatosis. 6. 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 7. Stable 5 mm fissural nodule in the left mid lung, probably an intrapulmonary lymph node. Electronically Signed   By: Almira Bar M.D.   On: 03/16/2023 04:59   CT Head Wo Contrast  Result Date: 03/16/2023 CLINICAL DATA:  62 year old male with dizziness, syncope, neurologic deficit. EXAM: CT HEAD WITHOUT CONTRAST TECHNIQUE: Contiguous axial images were obtained from the base of the skull through the vertex without intravenous contrast. RADIATION DOSE REDUCTION: This exam was performed according to the departmental dose-optimization program which includes automated exposure control, adjustment of the mA and/or kV according to patient size and/or use of iterative reconstruction technique. COMPARISON:  Brain MRI 04/02/2022 and earlier. FINDINGS: Brain: Stable cerebral volume. No midline shift,  ventriculomegaly, mass effect, evidence of mass lesion, intracranial hemorrhage or evidence of cortically based acute infarction. Chronic small vessel disease in the left basal ganglia, and left central pons appears stable. Stable mild basal ganglia vascular calcifications. Stable gray-white matter differentiation throughout the brain. Vascular: No suspicious intracranial vascular hyperdensity. Skull: Negative. Sinuses/Orbits: Increased but mild maxillary sinus mucosal thickening. Other Visualized paranasal sinuses and mastoids are stable and well aerated. Tympanic cavities are clear. Other: Visualized orbits and scalp soft tissues are within normal limits. IMPRESSION: Stable non contrast CT appearance of small vessel disease since last year. No acute intracranial abnormality. Electronically Signed   By: Odessa Fleming M.D.   On: 03/16/2023 04:23   DG Chest Port 1 View  Result Date: 03/16/2023 CLINICAL DATA:  Syncope. EXAM: PORTABLE CHEST 1 VIEW COMPARISON:  June 23, 2019 FINDINGS: The heart size and mediastinal contours are within normal limits. Both lungs are clear. The visualized skeletal structures are unremarkable. IMPRESSION: No active disease. Electronically Signed   By: Aram Candela M.D.   On: 03/16/2023 02:21    Procedures Procedures    Medications Ordered in ED Medications  iohexol (OMNIPAQUE) 350 MG/ML injection 100 mL (100 mLs Intravenous Contrast Given 03/16/23 0409)    ED Course/ Medical Decision Making/ A&P                             Medical Decision Making Amount and/or Complexity of Data Reviewed Labs: ordered. Radiology: ordered.  Risk Prescription drug management.   Patient here for evaluation of following syncopal event, EMS report was of dizziness.  Patient denies any dizziness but has no recollection of what occurred earlier.  He is intoxicated on examination but does not have focal deficits.  He does report palpitations and a D-dimer was obtained.  D-dimer was  elevated and a CTA was obtained.  CTA is negative for PE or pneumonia.  CTA does have incidental findings.  CT head is negative for acute infarct.  EtOH did return significantly elevated.  Patient remains intoxicated during his ED stay and is more somnolent on repeat assessment.  Patient care transferred pending metabolization of alcohol and repeat evaluation.        Final Clinical Impression(s) / ED Diagnoses Final diagnoses:  None    Rx / DC Orders ED Discharge Orders     None         Tilden Fossa, MD 03/16/23 [phone]

## 2023-03-16 NOTE — Discharge Instructions (Addendum)
If not able to follow-up with the nurse practitioner [place] recommend that she follow-up with the wellness clinic.  During your workup several CTs were done including CT of your chest which raises may be some question of some valvular disease or some coronary artery heart disease.  Make an appointment to follow-up with cardiology as well information provided above.  Return for any new or worse symptoms.

## 2023-05-27 NOTE — Progress Notes (Signed)
This encounter was created in error - please disregard.

## 2023-05-30 ENCOUNTER — Ambulatory Visit: Payer: Medicaid Other | Attending: Cardiovascular Disease | Admitting: Cardiovascular Disease

## 2024-01-06 IMAGING — MR MR HEAD W/O CM
10 of 11 series · 42 of 48 positions shown · non-contrast
Comparison: None Available.

CLINICAL DATA: Transient ischemic attack

EXAM:
MRI HEAD WITHOUT CONTRAST
TECHNIQUE: Multiplanar, multiecho pulse sequences of the brain and surrounding
structures were obtained without intravenous contrast.

[Series 5: DWI · axial · 3.0mm · 0.88mm/px · z∈[-100,+39]mm · 9 of 96 slices shown (1 of 4)]
[im 1/96]
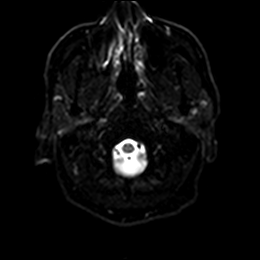
[im 12/96]
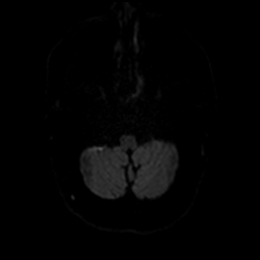
[im 24/96]
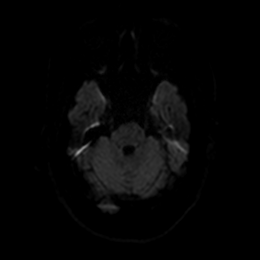
[im 36/96]
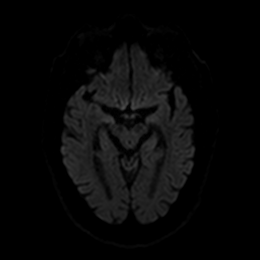
[im 48/96]
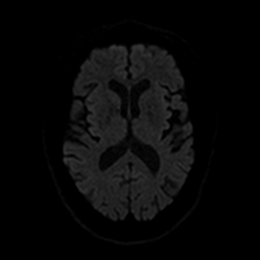
[im 60/96]
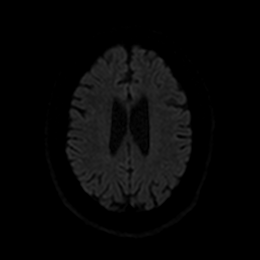
[im 72/96]
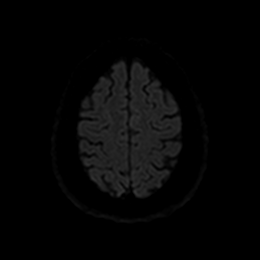
[im 84/96]
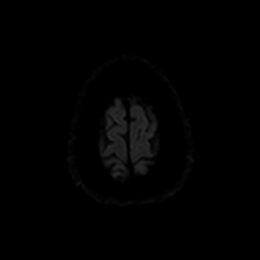
[im 96/96]
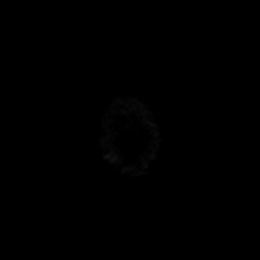

[Series 6: DWI · axial · 3.0mm · 0.88mm/px · z∈[-100,+39]mm · 4 of 48 slices shown (2 of 4)]
[im 1/48]
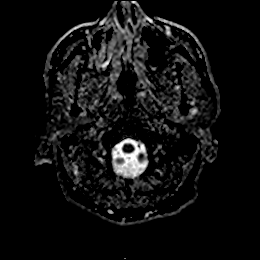
[im 16/48]
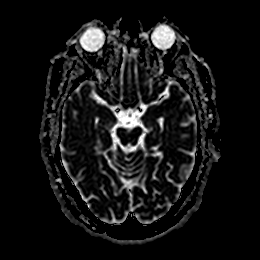
[im 32/48]
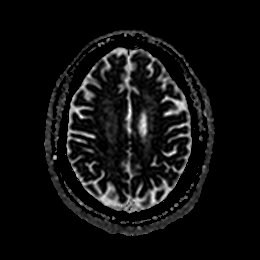
[im 48/48]
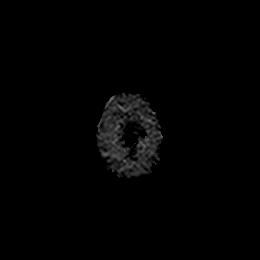

[Series 7: DWI · coronal · 4.0mm · 0.88mm/px · 6 of 64 slices shown (3 of 4)]
[im 1/64]
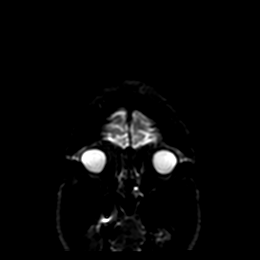
[im 13/64]
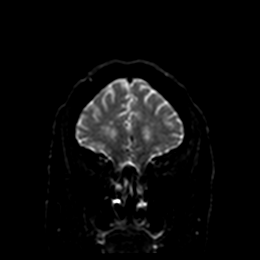
[im 26/64]
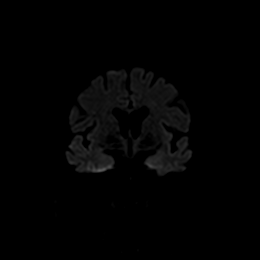
[im 38/64]
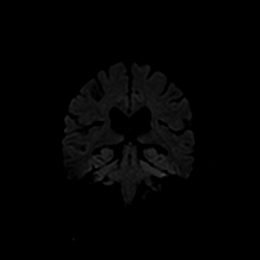
[im 51/64]
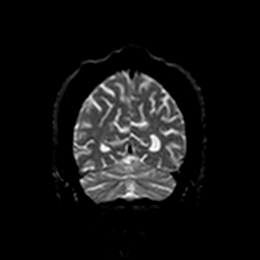
[im 64/64]
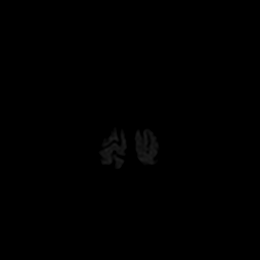

[Series 8: DWI · coronal · 4.0mm · 0.88mm/px · 3 of 32 slices shown (4 of 4)]
[im 1/32]
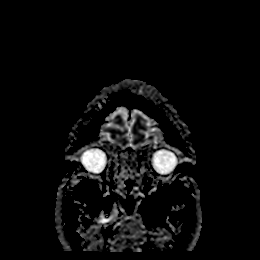
[im 16/32]
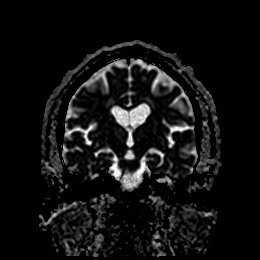
[im 32/32]
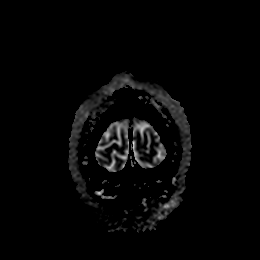

[Series 9: T1 · sagittal · 5.0mm · 0.78mm/px · 2 of 23 slices shown]
[im 1/23]
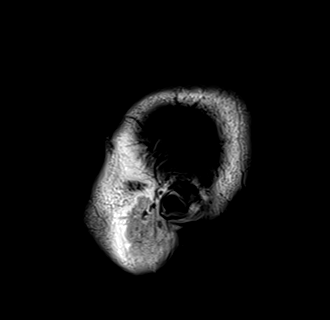
[im 23/23]
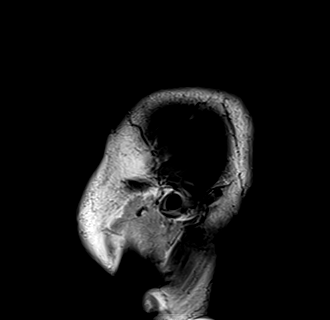

[Series 10: T2 · axial · 5.0mm · 0.72mm/px · z∈[-102,+40]mm · 2 of 25 slices shown (1 of 2)]
[im 1/25]
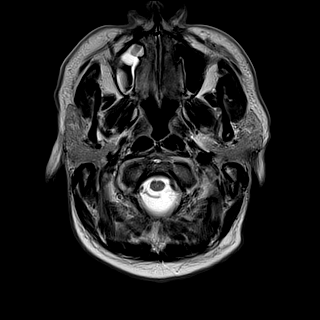
[im 25/25]
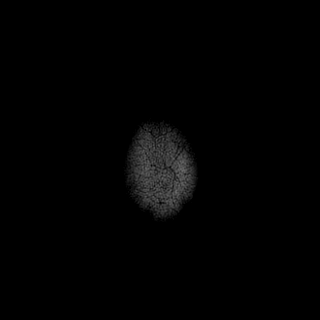

[Series 11: FLAIR · axial · 5.0mm · 0.45mm/px · z∈[-102,+40]mm · 2 of 25 slices shown]
[im 1/25]
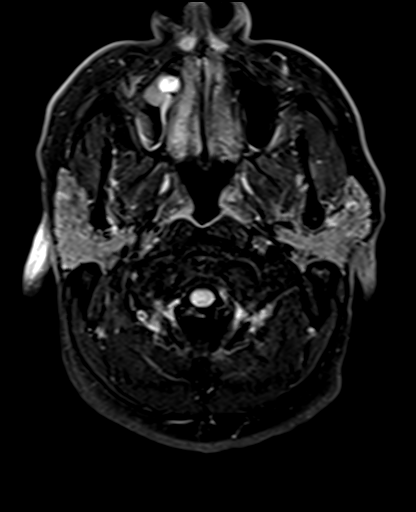
[im 25/25]
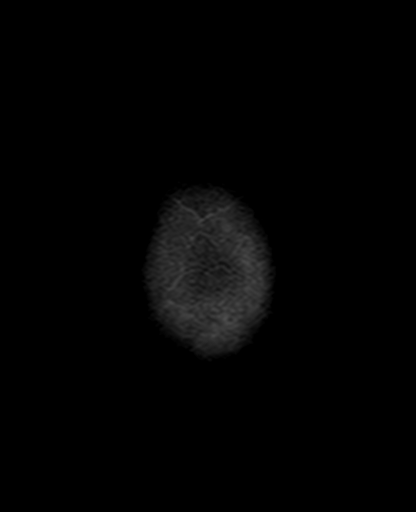

[Series 13: pha_images · axial · 3.0mm · 0.90mm/px · z∈[-118,+48]mm · 5 of 56 slices shown]
[im 1/56]
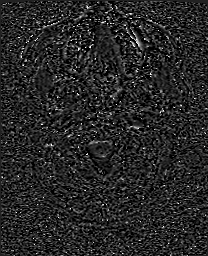
[im 14/56]
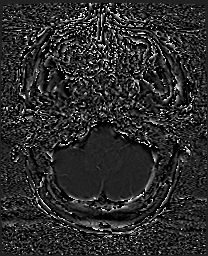
[im 28/56]
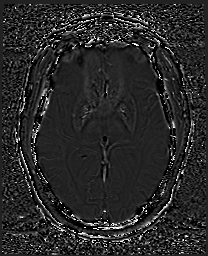
[im 42/56]
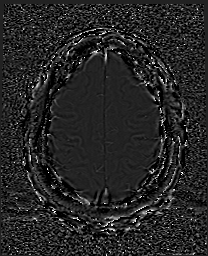
[im 56/56]
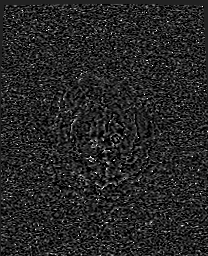

[Series 14: swi_images · axial · 3.0mm · 0.90mm/px · z∈[-118,+56]mm · 6 of 60 slices shown]
[im 1/60]
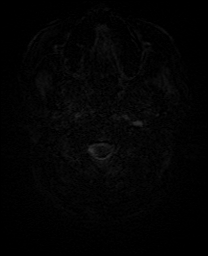
[im 12/60]
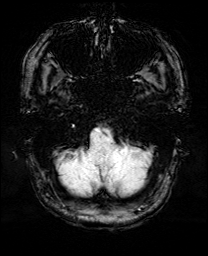
[im 24/60]
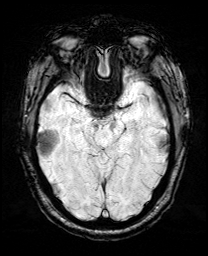
[im 36/60]
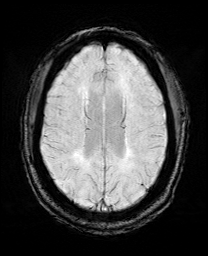
[im 48/60]
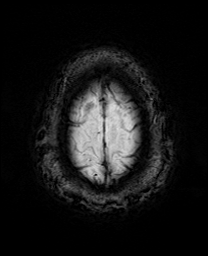
[im 60/60]
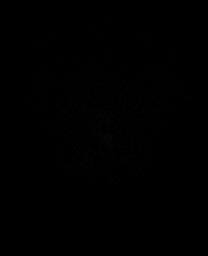

[Series 17: T2 · coronal · 5.0mm · 0.34mm/px · 3 of 29 slices shown (2 of 2)]
[im 1/29]
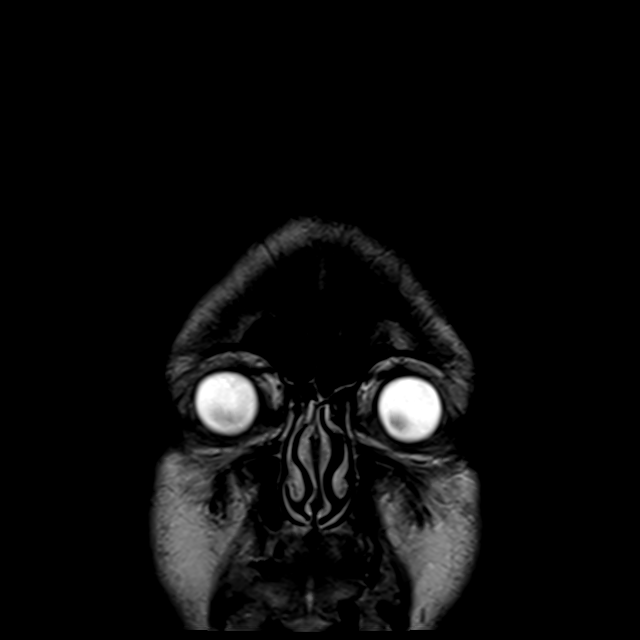
[im 15/29]
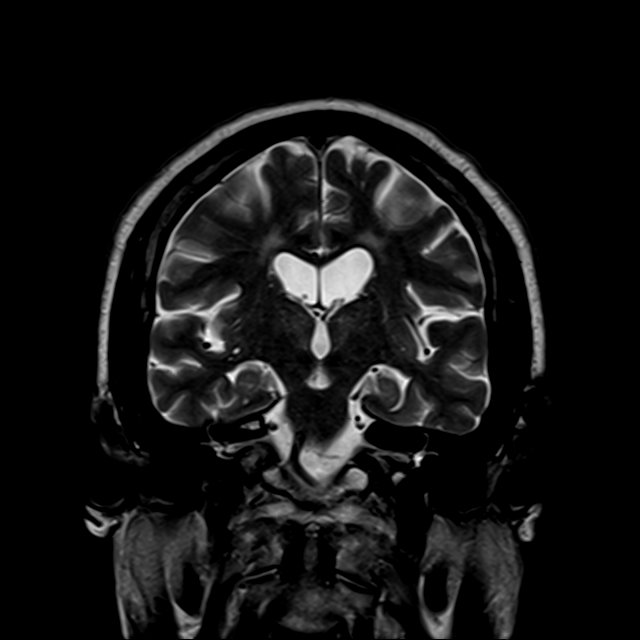
[im 29/29]
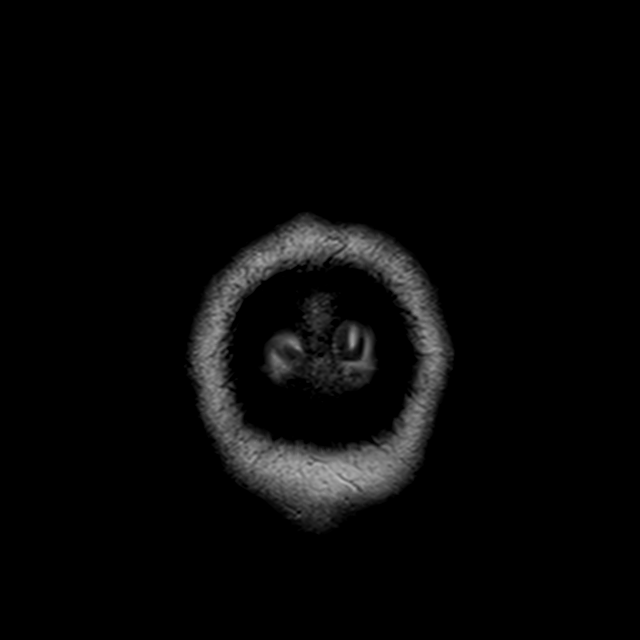

[42 of 48 positions shown; findings below may reference images not displayed]

FINDINGS: Brain: No acute infarct, mass effect or extra-axial collection. No
acute or chronic hemorrhage. There is multifocal hyperintense
T2-weighted signal within the white matter. Parenchymal volume and
CSF spaces are normal. The midline structures are normal.

Vascular: Major flow voids are preserved.

Skull and upper cervical spine: Normal calvarium and skull base.
Visualized upper cervical spine and soft tissues are normal.

Sinuses/Orbits:Right maxillary sinus mucosal thickening. Normal
orbits.
IMPRESSION: 1. No acute intracranial abnormality.
2. Findings of chronic small vessel ischemia.

## 2024-05-02 ENCOUNTER — Other Ambulatory Visit: Payer: Self-pay

## 2024-07-10 ENCOUNTER — Other Ambulatory Visit: Payer: Self-pay

## 2024-07-10 ENCOUNTER — Telehealth: Admitting: Nurse Practitioner

## 2024-07-10 DIAGNOSIS — G4709 Other insomnia: Secondary | ICD-10-CM

## 2024-07-10 MED ORDER — QUETIAPINE FUMARATE 100 MG PO TABS
100.0000 mg | ORAL_TABLET | Freq: Every day | ORAL | 0 refills | Status: AC
  Filled 2024-07-10: qty 30, 30d supply, fill #0

## 2024-07-10 NOTE — Progress Notes (Signed)
 Acute Video Visit    Virtual Visit Consent:   Dennis Vega, you are scheduled for a virtual visit with a [facility] provider today.     Just as with appointments in the office, your consent must be obtained to participate.  Your consent will be active for this visit and any virtual visit you may have with one of our providers in the next 365 days.     If you have a MyChart account, a copy of this consent can be sent to you electronically.  All virtual visits are billed to your insurance company just like a traditional visit in the office.    If the connection with a video visit is poor, the visit may have to be switched to a telephone visit.  With either a video or telephone visit, we are not always able to ensure that we have a secure connection.     I need to obtain your verbal consent now.   Are you willing to proceed with your visit today?    Dennis Vega has provided verbal consent on 07/10/2024 for a virtual visit (video or telephone).   Lauraine Kitty, FNP  Date: 07/10/2024 9:54 AM  Subjective:     Patient ID: Dennis Vega, male    DOB: 1961/05/07, 63 y.o.   MRN: [id number]  LILLETTE Lauraine Kitty, connected with  Dennis Vega  ([id number], 1961-03-11) on 07/10/24 at 11:00 AM EDT by a video-enabled telemedicine application and verified that I am speaking with the correct person using two identifiers.   Location: Patient: [facility]  Provider: Virtual Visit Location Provider: Home Office   I discussed the limitations of evaluation and management by telemedicine and the availability of in person appointments. The patient expressed understanding and agreed to proceed.     HPI  Dennis Vega is a 63 y.o. who identifies as a male who was assigned male at birth, and is being seen today for assistance with sleep medicine.   He has been a patient at the [facility] with Ronal Jenkins Houseman in the past. Being seen today for assistance in medication refills while awaiting a new PCP appointment next  month.   He denies the need for cholesterol or BP meds today and is requesting only his sleep medicine.  Medical history is significant for HTN, stroke, insomnia, hyperlipidemia   Denies any acute symptoms or other needs today      Objective:      Physical Exam Constitutional:      General: He is not in acute distress.    Appearance: Normal appearance. He is not ill-appearing.  HENT:     Nose: Nose normal.     Mouth/Throat:     Mouth: Mucous membranes are moist.  Pulmonary:     Effort: Pulmonary effort is normal.  Neurological:     Mental Status: He is alert and oriented to person, place, and time.  Psychiatric:        Mood and Affect: Mood normal.         Assessment & Plan:    Encouraged to use [facility] for acute needs while awaiting PCP appointment next month   Problem List Items Addressed This Visit   None Visit Diagnoses       Other insomnia    -  Primary   Relevant Medications   QUEtiapine (SEROQUEL) 100 MG tablet       Meds ordered this encounter  Medications   QUEtiapine (SEROQUEL) 100 MG tablet    Sig: Take  1 tablet (100 mg total) by mouth at bedtime.    Dispense:  30 tablet    Refill:  0      Follow Up Instructions: I discussed the assessment and treatment plan with the patient. The patient was provided an opportunity to ask questions and all were answered. The patient agreed with the plan and demonstrated an understanding of the instructions.  A copy of instructions were sent to the patient via MyChart unless otherwise noted below.     The patient was advised to call back or seek an in-person evaluation if the symptoms worsen or if the condition fails to improve as anticipated.    Lauraine Kitty, FNP  **Disclaimer: This note may have been dictated with voice recognition software. Similar sounding words can inadvertently be transcribed and this note may contain transcription errors which may not have been corrected upon publication of note.**

## 2024-07-19 ENCOUNTER — Other Ambulatory Visit: Payer: Self-pay

## 2024-08-16 ENCOUNTER — Emergency Department (HOSPITAL_COMMUNITY)

## 2024-08-16 ENCOUNTER — Encounter (HOSPITAL_COMMUNITY): Payer: Self-pay

## 2024-08-16 ENCOUNTER — Emergency Department (HOSPITAL_COMMUNITY): Admission: EM | Admit: 2024-08-16 | Discharge: 2024-08-17 | Disposition: A | Discharge status: Home / Self Care

## 2024-08-16 ENCOUNTER — Other Ambulatory Visit: Payer: Self-pay

## 2024-08-16 DIAGNOSIS — Z79899 Other long term (current) drug therapy: Secondary | ICD-10-CM | POA: Diagnosis not present

## 2024-08-16 DIAGNOSIS — I10 Essential (primary) hypertension: Secondary | ICD-10-CM | POA: Insufficient documentation

## 2024-08-16 DIAGNOSIS — S0101XA Laceration without foreign body of scalp, initial encounter: Secondary | ICD-10-CM | POA: Insufficient documentation

## 2024-08-16 DIAGNOSIS — S0990XA Unspecified injury of head, initial encounter: Secondary | ICD-10-CM | POA: Diagnosis present

## 2024-08-16 DIAGNOSIS — Z23 Encounter for immunization: Secondary | ICD-10-CM | POA: Insufficient documentation

## 2024-08-16 LAB — CBC WITH DIFFERENTIAL/PLATELET
Abs Immature Granulocytes: 0.02 K/uL (ref 0.00–0.07)
Basophils Absolute: 0 K/uL (ref 0.0–0.1)
Basophils Relative: 1 %
Eosinophils Absolute: 0 K/uL (ref 0.0–0.5)
Eosinophils Relative: 1 %
HCT: 49.7 % (ref 39.0–52.0)
Hemoglobin: 17 g/dL (ref 13.0–17.0)
Immature Granulocytes: 0 %
Lymphocytes Relative: 31 %
Lymphs Abs: 1.8 K/uL (ref 0.7–4.0)
MCH: 34.8 pg — ABNORMAL HIGH (ref 26.0–34.0)
MCHC: 34.2 g/dL (ref 30.0–36.0)
MCV: 101.6 fL — ABNORMAL HIGH (ref 80.0–100.0)
Monocytes Absolute: 0.5 K/uL (ref 0.1–1.0)
Monocytes Relative: 9 %
Neutro Abs: 3.5 K/uL (ref 1.7–7.7)
Neutrophils Relative %: 58 %
Platelets: 218 K/uL (ref 150–400)
RBC: 4.89 MIL/uL (ref 4.22–5.81)
RDW: 12.3 % (ref 11.5–15.5)
WBC: 5.9 K/uL (ref 4.0–10.5)
nRBC: 0 % (ref 0.0–0.2)

## 2024-08-16 LAB — URINALYSIS, ROUTINE W REFLEX MICROSCOPIC
Bacteria, UA: NONE SEEN
Bilirubin Urine: NEGATIVE
Glucose, UA: NEGATIVE mg/dL
Ketones, ur: NEGATIVE mg/dL
Leukocytes,Ua: NEGATIVE
Nitrite: NEGATIVE
Protein, ur: NEGATIVE mg/dL
Specific Gravity, Urine: 1.011 (ref 1.005–1.030)
pH: 6 (ref 5.0–8.0)

## 2024-08-16 LAB — RAPID URINE DRUG SCREEN, HOSP PERFORMED
Amphetamines: NOT DETECTED
Barbiturates: NOT DETECTED
Benzodiazepines: NOT DETECTED
Cocaine: POSITIVE — AB
Opiates: NOT DETECTED
Tetrahydrocannabinol: NOT DETECTED

## 2024-08-16 LAB — COMPREHENSIVE METABOLIC PANEL WITH GFR
ALT: 16 U/L (ref 0–44)
AST: 24 U/L (ref 15–41)
Albumin: 3.6 g/dL (ref 3.5–5.0)
Alkaline Phosphatase: 69 U/L (ref 38–126)
Anion gap: 10 (ref 5–15)
BUN: 7 mg/dL — ABNORMAL LOW (ref 8–23)
CO2: 22 mmol/L (ref 22–32)
Calcium: 8.5 mg/dL — ABNORMAL LOW (ref 8.9–10.3)
Chloride: 107 mmol/L (ref 98–111)
Creatinine, Ser: 0.78 mg/dL (ref 0.61–1.24)
GFR, Estimated: >60 mL/min (ref >60)
Glucose, Bld: 104 mg/dL — ABNORMAL HIGH (ref 70–99)
Potassium: 4 mmol/L (ref 3.5–5.1)
Sodium: 139 mmol/L (ref 135–145)
Total Bilirubin: 1.5 mg/dL — ABNORMAL HIGH (ref 0.0–1.2)
Total Protein: 6.6 g/dL (ref 6.5–8.1)

## 2024-08-16 LAB — SALICYLATE LEVEL: Salicylate Lvl: <7 mg/dL — ABNORMAL LOW (ref 7.0–30.0)

## 2024-08-16 LAB — MAGNESIUM: Magnesium: 2.3 mg/dL (ref 1.7–2.4)

## 2024-08-16 LAB — ACETAMINOPHEN LEVEL: Acetaminophen (Tylenol), Serum: <10 ug/mL — ABNORMAL LOW (ref 10–30)

## 2024-08-16 LAB — ETHANOL: Alcohol, Ethyl (B): 164 mg/dL — ABNORMAL HIGH (ref <15)

## 2024-08-16 MED ORDER — TETANUS-DIPHTH-ACELL PERTUSSIS 5-2-15.5 LF-MCG/0.5 IM SUSP
0.5000 mL | Freq: Once | INTRAMUSCULAR | Status: AC
  Administered 2024-08-16: 0.5 mL via INTRAMUSCULAR
  Filled 2024-08-16: qty 0.5

## 2024-08-16 MED ORDER — LACTATED RINGERS IV BOLUS
1000.0000 mL | Freq: Once | INTRAVENOUS | Status: AC
  Administered 2024-08-16: 1000 mL via INTRAVENOUS

## 2024-08-16 MED ORDER — NALOXONE HCL 0.4 MG/ML IJ SOLN
0.4000 mg | Freq: Once | INTRAMUSCULAR | Status: AC
  Administered 2024-08-16: 0.4 mg via INTRAVENOUS
  Filled 2024-08-16: qty 1

## 2024-08-16 MED ORDER — LIDOCAINE HCL (PF) 1 % IJ SOLN
10.0000 mL | Freq: Once | INTRAMUSCULAR | Status: AC
  Administered 2024-08-16: 10 mL
  Filled 2024-08-16: qty 10
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
## 2024-08-17 NOTE — Discharge Instructions (Addendum)
 Do not let your laceration (cut) get wet for the next 48 hours. After that you may allow soapy water to drain down the wound to clean it.  Please do not scrub.  Do not submerge the wound under water for the next 2 weeks.   To minimize scarring, you can apply a vaseline based ointment for the next 2 weeks and keep it out of direct sun light. After that, you may apply sunscreen for the next several months.   Your stitches and staples will need to be removed in 5-7 days   Return if your wound appears to be infected (see laceration care instructions).
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
